# Patient Record
Sex: Female | Born: 1980 | Race: White | Hispanic: No | Marital: Married | Smoking: Former smoker
Health system: Southern US, Community
[De-identification: ages and names within clinical notes are randomized; demographics above are authoritative.]

## PROBLEM LIST (undated history)

## (undated) DIAGNOSIS — E079 Disorder of thyroid, unspecified: Secondary | ICD-10-CM

## (undated) HISTORY — DX: Disorder of thyroid, unspecified: E07.9

---

## 2015-06-14 ENCOUNTER — Other Ambulatory Visit: Payer: Self-pay | Admitting: Obstetrics and Gynecology

## 2015-06-14 DIAGNOSIS — R928 Other abnormal and inconclusive findings on diagnostic imaging of breast: Secondary | ICD-10-CM

## 2015-07-09 ENCOUNTER — Ambulatory Visit
Admission: RE | Admit: 2015-07-09 | Discharge: 2015-07-09 | Disposition: A | Discharge status: Home / Self Care | Payer: Managed Care, Other (non HMO) | Source: Ambulatory Visit | Attending: Obstetrics and Gynecology | Admitting: Obstetrics and Gynecology

## 2015-07-09 DIAGNOSIS — R928 Other abnormal and inconclusive findings on diagnostic imaging of breast: Secondary | ICD-10-CM

## 2015-07-12 ENCOUNTER — Other Ambulatory Visit: Payer: Self-pay

## 2015-12-03 ENCOUNTER — Ambulatory Visit (INDEPENDENT_AMBULATORY_CARE_PROVIDER_SITE_OTHER)
Admission: RE | Admit: 2015-12-03 | Discharge: 2015-12-03 | Disposition: A | Discharge status: Home / Self Care | Payer: Managed Care, Other (non HMO) | Source: Ambulatory Visit | Attending: Nurse Practitioner | Admitting: Nurse Practitioner

## 2015-12-03 ENCOUNTER — Other Ambulatory Visit (INDEPENDENT_AMBULATORY_CARE_PROVIDER_SITE_OTHER): Payer: Managed Care, Other (non HMO)

## 2015-12-03 ENCOUNTER — Other Ambulatory Visit: Payer: Self-pay | Admitting: *Deleted

## 2015-12-03 ENCOUNTER — Ambulatory Visit (INDEPENDENT_AMBULATORY_CARE_PROVIDER_SITE_OTHER): Payer: Managed Care, Other (non HMO) | Admitting: Nurse Practitioner

## 2015-12-03 ENCOUNTER — Encounter: Payer: Self-pay | Admitting: Nurse Practitioner

## 2015-12-03 VITALS — BP 118/78 | HR 68 | Ht 66.0 in | Wt 141.0 lb

## 2015-12-03 DIAGNOSIS — Z0001 Encounter for general adult medical examination with abnormal findings: Secondary | ICD-10-CM

## 2015-12-03 DIAGNOSIS — R062 Wheezing: Secondary | ICD-10-CM

## 2015-12-03 DIAGNOSIS — Z716 Tobacco abuse counseling: Secondary | ICD-10-CM

## 2015-12-03 DIAGNOSIS — F17200 Nicotine dependence, unspecified, uncomplicated: Secondary | ICD-10-CM

## 2015-12-03 DIAGNOSIS — E041 Nontoxic single thyroid nodule: Secondary | ICD-10-CM | POA: Diagnosis not present

## 2015-12-03 DIAGNOSIS — E559 Vitamin D deficiency, unspecified: Secondary | ICD-10-CM | POA: Insufficient documentation

## 2015-12-03 DIAGNOSIS — N6452 Nipple discharge: Secondary | ICD-10-CM | POA: Insufficient documentation

## 2015-12-03 LAB — CBC WITH DIFFERENTIAL/PLATELET
BASOS PCT: 0.3 % (ref 0.0–3.0)
Basophils Absolute: 0 10*3/uL (ref 0.0–0.1)
EOS PCT: 2.4 % (ref 0.0–5.0)
Eosinophils Absolute: 0.2 10*3/uL (ref 0.0–0.7)
HCT: 40.9 % (ref 36.0–46.0)
HEMOGLOBIN: 14.2 g/dL (ref 12.0–15.0)
Lymphocytes Relative: 38.5 % (ref 12.0–46.0)
Lymphs Abs: 3.9 10*3/uL (ref 0.7–4.0)
MCHC: 34.6 g/dL (ref 30.0–36.0)
MCV: 96.4 fl (ref 78.0–100.0)
MONO ABS: 0.7 10*3/uL (ref 0.1–1.0)
Monocytes Relative: 7.1 % (ref 3.0–12.0)
NEUTROS ABS: 5.3 10*3/uL (ref 1.4–7.7)
Neutrophils Relative %: 51.7 % (ref 43.0–77.0)
PLATELETS: 237 10*3/uL (ref 150.0–400.0)
RBC: 4.24 Mil/uL (ref 3.87–5.11)
RDW: 12.1 % (ref 11.5–15.5)
WBC: 10.3 10*3/uL (ref 4.0–10.5)

## 2015-12-03 LAB — COMPREHENSIVE METABOLIC PANEL
ALBUMIN: 4.7 g/dL (ref 3.5–5.2)
ALT: 15 U/L (ref 0–35)
AST: 18 U/L (ref 0–37)
Alkaline Phosphatase: 47 U/L (ref 39–117)
BILIRUBIN TOTAL: 0.4 mg/dL (ref 0.2–1.2)
BUN: 10 mg/dL (ref 6–23)
CHLORIDE: 104 meq/L (ref 96–112)
CO2: 27 mEq/L (ref 19–32)
CREATININE: 0.65 mg/dL (ref 0.40–1.20)
Calcium: 9.2 mg/dL (ref 8.4–10.5)
GFR: 108.64 mL/min (ref >60.00)
Glucose, Bld: 89 mg/dL (ref 70–99)
Potassium: 3.8 mEq/L (ref 3.5–5.1)
SODIUM: 140 meq/L (ref 135–145)
TOTAL PROTEIN: 7.7 g/dL (ref 6.0–8.3)

## 2015-12-03 LAB — LIPID PANEL
CHOLESTEROL: 158 mg/dL (ref 0–200)
HDL: 62.4 mg/dL (ref >39.00)
LDL CALC: 82 mg/dL (ref 0–99)
NonHDL: 95.72
Total CHOL/HDL Ratio: 3
Triglycerides: 70 mg/dL (ref 0.0–149.0)
VLDL: 14 mg/dL (ref 0.0–40.0)

## 2015-12-03 MED ORDER — VITAMIN D 1000 UNITS PO TABS: 2000.0000 [IU] | ORAL_TABLET | Freq: Every day | ORAL | 0 refills | Status: DC

## 2015-12-03 MED ORDER — ALBUTEROL SULFATE HFA 108 (90 BASE) MCG/ACT IN AERS: 1.0000 | INHALATION_SPRAY | Freq: Four times a day (QID) | RESPIRATORY_TRACT | 2 refills | Status: DC | PRN

## 2015-12-03 MED ORDER — BUPROPION HCL ER (SMOKING DET) 150 MG PO TB12: 150.0000 mg | ORAL_TABLET | Freq: Two times a day (BID) | ORAL | 1 refills | Status: DC

## 2015-12-03 NOTE — Progress Notes (Signed)
Normal results, see office note

## 2015-12-03 NOTE — Assessment & Plan Note (Addendum)
ductogram never done. Prolactin 2016: normal Mammogram 2016: normal.  referred to endocrinologist

## 2015-12-03 NOTE — Progress Notes (Signed)
Subjective:    Patient ID: Bonnie Parsons, female    DOB: [birth year], 35 y.o.   MRN: [id number]  Patient presents today for complete physical or establish care (new patient).  Wheezing   This is a recurrent problem. The current episode started 1 to 4 weeks ago. The problem occurs intermittently. The problem has been waxing and waning. Pertinent negatives include no chest pain, chills, coughing, diarrhea, fever, headaches, rash, rhinorrhea, shortness of breath, sore throat, sputum production or vomiting. The symptoms are aggravated by any activity. She has tried nothing for the symptoms. There is no history of asthma, bronchiolitis, CAD, chronic lung disease, COPD, DVT, heart failure, past MI, PE or pneumonia.   Patient moved to [place] from [facility] 50month ago. She las last seen by last pcp 1year ago.  Left Breast Discharge: Chronic and intermittent for over 1year. Denies any pregnancy, current use of nuvaring, denies any nipple stimulation activity, last mammogram 2016 (normal per patient), last prolactin 2016(normal). States she has an enlarged pituitory gland as teenager.([place] Never had ductogram or biopsy.  Tobacco use: Smoked for 252yr Has tried chantix and nicorette in past and unsuccessful.  Thyroid nodule: US[place]ore[place]yroid last done 2016: benign. Recommended yearly check. Patient is to bring record of last thyroid [place] thyroid function was normal.  Immunizations: (TDAP, Hep C screen, Pneumovax, Influenza, zoster)  Health Maintenance  Topic Date Due  . Pap Smear  04/21/1999  . Flu Shot  11/20/2016*  . Tetanus Vaccine  11/20/2016*  . HIV Screening  11/20/2016*  *Topic was postponed. The date shown is not the original due date.   Diet:none Weight:  Wt Readings from Last 3 Encounters:  12/03/15 141 lb (64 kg)   Exercise:none Fall Risk: Fall Risk  12/03/2015  Falls in the past year? No   Depression/Suicide: Depression screen PHQ 2/9 12/03/2015    Decreased Interest 0  Down, Depressed, Hopeless 0  PHQ - 2 Score 0   No flowsheet data found. Pap Smear (every 3y34yror >21-29 without HPV, every 70yr60yrr >30-670yr48yrh HPV): last done 2016 per patient, record requested. Advanced Directive: Advanced Directives 12/03/2015  Does patient have an advance directive? No  Would patient like information on creating an advanced directive? No - patient declined information   Sexual History (birth control, marital status, STD):married, sexually active, use of nuvaring.  Medications and allergies reviewed with patient and updated if appropriate.  Patient Active Problem List   Diagnosis Date Noted  . Thyroid nodule 12/03/2015  . Tobacco use disorder 12/03/2015  . Vitamin D deficiency 12/03/2015  . Breast discharge 12/03/2015    No current outpatient prescriptions on file prior to visit.   No current facility-administered medications on file prior to visit.     Past Medical History:  Diagnosis Date  . Thyroid disease     Past Surgical History:  Procedure Laterality Date  . CESAREAN SECTION      Social History   Social History  . Marital status: Married    Spouse name: N/A  . Number of children: N/A  . Years of education: N/A   Social History Main Topics  . Smoking status: Current Some Day Smoker    Packs/day: 0.25    Years: 20.00    Types: Cigarettes  . Smokeless tobacco: Current User  . Alcohol use No  . Drug use: No  . Sexual activity: Yes    Birth control/ protection: Inserts     Comment: nuvaring  x 21yr,    Other Topics Concern  . None   Social History Narrative  . None    Family History  Problem Relation Age of Onset  . Cancer Father   . Hyperlipidemia Paternal Grandmother   . Thyroid disease Mother         Review of Systems  Constitutional: Negative for chills, fever, malaise/fatigue and weight loss.  HENT: Negative for congestion, rhinorrhea and sore throat.   Eyes:       Negative for visual  changes  Respiratory: Positive for wheezing. Negative for cough, sputum production and shortness of breath.        Wheezing with exercise.  Cardiovascular: Negative for chest pain, palpitations and leg swelling.  Gastrointestinal: Negative for blood in stool, constipation, diarrhea, heartburn and vomiting.  Genitourinary: Negative for dysuria, frequency and urgency.  Musculoskeletal: Negative for falls, joint pain and myalgias.  Skin: Negative for rash.  Neurological: Negative for dizziness, sensory change and headaches.  Endo/Heme/Allergies: Does not bruise/bleed easily.  Psychiatric/Behavioral: Negative for depression, substance abuse and suicidal ideas. The patient is not nervous/anxious.     Objective:   Vitals:   12/03/15 1104  BP: 118/78  Pulse: 68    Body mass index is 22.76 kg/m.   Physical Examination:  Physical Exam  Constitutional: She is oriented to person, place, and time and well-developed, well-nourished, and in no distress. No distress.  HENT:  Right Ear: External ear normal.  Left Ear: External ear normal.  Nose: Nose normal.  Mouth/Throat: Oropharynx is clear and moist. No oropharyngeal exudate.  Eyes: Conjunctivae and EOM are normal. Pupils are equal, round, and reactive to light. No scleral icterus.  Neck: Normal range of motion. Neck supple. No thyromegaly present.  Cardiovascular: Normal rate, normal heart sounds and intact distal pulses.   Pulmonary/Chest: Effort normal and breath sounds normal. She exhibits no tenderness.  Abdominal: Soft. Bowel sounds are normal. She exhibits no distension. There is no tenderness.  Musculoskeletal: Normal range of motion. She exhibits no edema or tenderness.  Lymphadenopathy:    She has no cervical adenopathy.  Neurological: She is alert and oriented to person, place, and time. Gait normal.  Skin: Skin is warm and dry.  Psychiatric: Affect and judgment normal.  Vitals reviewed.  Spent 169ms discussing different  smoking cessation aids.  ASSESSMENT and PLAN:  MaFatishaas seen today for annual exam.  Diagnoses and all orders for this visit:  Encounter for preventative adult health care exam with abnormal findings -     Comp Met (CMET); Future -     Lipid panel; Future -     CBC w/Diff; Future -     cholecalciferol (VITAMIN D) 1000 units tablet; Take 2 tablets (2,000 Units total) by mouth daily.  Thyroid nodule -     [place]ROID; Future  Tobacco use disorder -     DG Chest 2 View; Future -     buPROPion (ZYBAN) 150 MG 12 hr tablet; Take 1 tablet (150 mg total) by mouth 2 (two) times daily.  Wheezing -     DG Chest 2 View; Future  Breast discharge Comments: left Orders: -     Ambulatory referral to Endocrinology  Encounter for tobacco use cessation counseling  Vitamin D deficiency -     cholecalciferol (VITAMIN D) 1000 units tablet; Take 2 tablets (2,000 Units total) by mouth daily.    No problem-specific Assessment & Plan notes found for this encounter.      Follow  up: Return in about 6 months (around 06/02/2016).  Wilfred Lacy, NP

## 2015-12-03 NOTE — Assessment & Plan Note (Signed)
Patient agreed to try zyban. Patient does not have quit date at this time.

## 2015-12-03 NOTE — Patient Instructions (Signed)
Go to basement for lab and CXR. If you decide to start zyban for smoking cessation, you will been to return to office in 39month after starting. We discussed common side effects such as nausea, drowsiness and weight gain.  Also discussed rare but serious side effect of suicide ideation.  She is instructed to discontinue medication go directly to ED if this occurs.  Pt verbalizes understanding.   Plan follow up in 1 month to evaluate progress.    Bupropion sustained-release tablets (smoking cessation) What is this medicine? BUPROPION (byoo PROE pee on) is used to help people quit smoking. This medicine may be used for other purposes; ask your health care provider or pharmacist if you have questions. What should I tell my health care provider before I take this medicine? They need to know if you have any of these conditions: -an eating disorder, such as anorexia or bulimia -bipolar disorder or psychosis -diabetes or high blood sugar, treated with medication -glaucoma -head injury or brain tumor -heart disease, previous heart attack, or irregular heart beat -high blood pressure -kidney or liver disease -seizures -suicidal thoughts or a previous suicide attempt -Tourette's syndrome -weight loss -an unusual or allergic reaction to bupropion, other medicines, foods, dyes, or preservatives -breast-feeding -pregnant or trying to become pregnant How should I use this medicine? Take this medicine by mouth with a glass of water. Follow the directions on the prescription label. You can take it with or without food. If it upsets your stomach, take it with food. Do not cut, crush or chew this medicine. Take your medicine at regular intervals. If you take this medicine more than once a day, take your second dose at least 8 hours after you take your first dose. To limit difficulty in sleeping, avoid taking this medicine at bedtime. Do not take your medicine more often than directed. Do not stop taking this  medicine suddenly except upon the advice of your doctor. Stopping this medicine too quickly may cause serious side effects. A special MedGuide will be given to you by the pharmacist with each prescription and refill. Be sure to read this information carefully each time. Talk to your pediatrician regarding the use of this medicine in children. Special care may be needed. Overdosage: If you think you have taken too much of this medicine contact a poison control center or emergency room at once. NOTE: This medicine is only for you. Do not share this medicine with others. What if I miss a dose? If you miss a dose, skip the missed dose and take your next tablet at the regular time. There should be at least 8 hours between doses. Do not take double or extra doses. What may interact with this medicine? Do not take this medicine with any of the following medications: -linezolid -MAOIs like Azilect, Carbex, Eldepryl, Marplan, Nardil, and Parnate -methylene blue (injected into a vein) -other medicines that contain bupropion like Wellbutrin This medicine may also interact with the following medications: -alcohol -certain medicines for anxiety or sleep -certain medicines for blood pressure like metoprolol, propranolol -certain medicines for depression or psychotic disturbances -certain medicines for HIV or AIDS like efavirenz, lopinavir, nelfinavir, ritonavir -certain medicines for irregular heart beat like propafenone, flecainide -certain medicines for Parkinson's disease like amantadine, levodopa -certain medicines for seizures like carbamazepine, phenytoin, phenobarbital -cimetidine -clopidogrel -cyclophosphamide -furazolidone -isoniazid -nicotine -orphenadrine -procarbazine -steroid medicines like prednisone or cortisone -stimulant medicines for attention disorders, weight loss, or to stay awake -tamoxifen -theophylline -thiotepa -ticlopidine -tramadol -warfarin This  list may not  describe all possible interactions. Give your health care provider a list of all the medicines, herbs, non-prescription drugs, or dietary supplements you use. Also tell them if you smoke, drink alcohol, or use illegal drugs. Some items may interact with your medicine. What should I watch for while using this medicine? Visit your doctor or health care professional for regular checks on your progress. This medicine should be used together with a patient support program. It is important to participate in a behavioral program, counseling, or other support program that is recommended by your health care professional. Patients and their families should watch out for new or worsening thoughts of suicide or depression. Also watch out for sudden changes in feelings such as feeling anxious, agitated, panicky, irritable, hostile, aggressive, impulsive, severely restless, overly excited and hyperactive, or not being able to sleep. If this happens, especially at the beginning of treatment or after a change in dose, call your health care professional. Avoid alcoholic drinks while taking this medicine. Drinking excessive alcoholic beverages, using sleeping or anxiety medicines, or quickly stopping the use of these agents while taking this medicine may increase your risk for a seizure. Do not drive or use heavy machinery until you know how this medicine affects you. This medicine can impair your ability to perform these tasks. Do not take this medicine close to bedtime. It may prevent you from sleeping. Your mouth may get dry. Chewing sugarless gum or sucking hard candy, and drinking plenty of water may help. Contact your doctor if the problem does not go away or is severe. Do not use nicotine patches or chewing gum without the advice of your doctor or health care professional while taking this medicine. You may need to have your blood pressure taken regularly if your doctor recommends that you use both nicotine and this  medicine together. What side effects may I notice from receiving this medicine? Side effects that you should report to your doctor or health care professional as soon as possible: -allergic reactions like skin rash, itching or hives, swelling of the face, lips, or tongue -breathing problems -changes in vision -confusion -fast or irregular heartbeat -hallucinations -increased blood pressure -redness, blistering, peeling or loosening of the skin, including inside the mouth -seizures -suicidal thoughts or other mood changes -unusually weak or tired -vomiting Side effects that usually do not require medical attention (report to your doctor or health care professional if they continue or are bothersome): -change in sex drive or performance -constipation -headache -loss of appetite -nausea -tremors -weight loss This list may not describe all possible side effects. Call your doctor for medical advice about side effects. You may report side effects to FDA at 1-800-FDA-1088. Where should I keep my medicine? Keep out of the reach of children. Store at room temperature between 20 and 25 degrees C (68 and 77 degrees F). Protect from light. Keep container tightly closed. Throw away any unused medicine after the expiration date. NOTE: This sheet is a summary. It may not cover all possible information. If you have questions about this medicine, talk to your doctor, pharmacist, or health care provider.    2016, Elsevier/Gold Standard. (2012-10-04 10:55:10)

## 2015-12-03 NOTE — Progress Notes (Signed)
Pre visit review using our clinic review tool, if applicable. No additional management support is needed unless otherwise documented below in the visit note. 

## 2016-03-24 ENCOUNTER — Ambulatory Visit
Admission: RE | Admit: 2016-03-24 | Discharge: 2016-03-24 | Disposition: A | Discharge status: Home / Self Care | Payer: Managed Care, Other (non HMO) | Source: Ambulatory Visit | Attending: Nurse Practitioner | Admitting: Nurse Practitioner

## 2016-03-24 DIAGNOSIS — E041 Nontoxic single thyroid nodule: Secondary | ICD-10-CM

## 2016-06-02 ENCOUNTER — Ambulatory Visit (INDEPENDENT_AMBULATORY_CARE_PROVIDER_SITE_OTHER): Payer: 59 | Admitting: Nurse Practitioner

## 2016-06-02 ENCOUNTER — Encounter: Payer: Self-pay | Admitting: Nurse Practitioner

## 2016-06-02 ENCOUNTER — Other Ambulatory Visit: Payer: 59

## 2016-06-02 VITALS — BP 110/74 | HR 79 | Temp 98.2°F | Ht 66.0 in | Wt 141.0 lb

## 2016-06-02 DIAGNOSIS — Z3044 Encounter for surveillance of vaginal ring hormonal contraceptive device: Secondary | ICD-10-CM

## 2016-06-02 DIAGNOSIS — E041 Nontoxic single thyroid nodule: Secondary | ICD-10-CM

## 2016-06-02 DIAGNOSIS — N6452 Nipple discharge: Secondary | ICD-10-CM

## 2016-06-02 DIAGNOSIS — E559 Vitamin D deficiency, unspecified: Secondary | ICD-10-CM | POA: Diagnosis not present

## 2016-06-02 LAB — PROLACTIN: Prolactin: 5.1 ng/mL

## 2016-06-02 MED ORDER — NUVARING 0.12-0.015 MG/24HR VA RING: 1.0000 | VAGINAL_RING | VAGINAL | 5 refills | Status: DC

## 2016-06-02 NOTE — Progress Notes (Signed)
Pre visit review using our clinic review tool, if applicable. No additional management support is needed unless otherwise documented below in the visit note. 

## 2016-06-02 NOTE — Progress Notes (Signed)
Subjective:  Patient ID: Bonnie Parsons, female    DOB: [birth year]  Age: 36 y.o. MRN: [id number]  CC: Follow-up (6 mo fu/nuvaring consult?--had pap last year)   HPI  She denies any acute complaint today.  Contraceptive refill: She will like nuvaring refilled. Previous prescription provided by GYN. Still have 3rings, and uses as prescribed. Current ongoing menstrual cycle.  Thyroid nodule: Stable, no throat pain, no change in voice of dysphagia.  Nipple discharge: Stable, no breast pain.  Outpatient Medications Prior to Visit  Medication Sig Dispense Refill  . cholecalciferol (VITAMIN D) 1000 units tablet Take 2 tablets (2,000 Units total) by mouth daily. 60 tablet 0  . NUVARING 0.12-0.015 MG/24HR vaginal ring     . albuterol (PROVENTIL HFA;VENTOLIN HFA) 108 (90 Base) MCG/ACT inhaler Inhale 1-2 puffs into the lungs every 6 (six) hours as needed for wheezing or shortness of breath. (Patient not taking: Reported on 06/02/2016) 1 Inhaler 2  . buPROPion (ZYBAN) 150 MG 12 hr tablet Take 1 tablet (150 mg total) by mouth 2 (two) times daily. (Patient not taking: Reported on 06/02/2016) 60 tablet 1   No facility-administered medications prior to visit.     ROS See HPI  Objective:  BP 110/74   Pulse 79   Temp 98.2 F (36.8 C)   Ht  (1.676 m)   Wt 141 lb (64 kg)   SpO2 99%   BMI 22.76 kg/m   BP Readings from Last 3 Encounters:  06/02/16 110/74  12/03/15 118/78    Wt Readings from Last 3 Encounters:  06/02/16 141 lb (64 kg)  12/03/15 141 lb (64 kg)    Physical Exam  Constitutional: She is oriented to person, place, and time. No distress.  Neck: Normal range of motion. Neck supple. No thyromegaly present.  Cardiovascular: Normal rate and normal heart sounds.   Pulmonary/Chest: Effort normal and breath sounds normal.  Musculoskeletal: She exhibits no edema.  Lymphadenopathy:    She has no cervical adenopathy.  Neurological: She is alert and oriented to person,  place, and time.  Skin: Skin is warm and dry.  Vitals reviewed.   Lab Results  Component Value Date   WBC 10.3 12/03/2015   HGB 14.2 12/03/2015   HCT 40.9 12/03/2015   PLT 237.0 12/03/2015   GLUCOSE 89 12/03/2015   CHOL 158 12/03/2015   TRIG 70.0 12/03/2015   HDL 62.40 12/03/2015   LDLCALC 82 12/03/2015   ALT 15 12/03/2015   AST 18 12/03/2015   NA 140 12/03/2015   K 3.8 12/03/2015   CL 104 12/03/2015   CREATININE 0.65 12/03/2015   BUN 10 12/03/2015   CO2 27 12/03/2015    US Thyroid  Result Date: 03/24/2016 CLINICAL DATA:  Possible thyroid nodule by outside ultrasound EXAM: THYROID ULTRASOUND TECHNIQUE: Ultrasound examination of the thyroid gland and adjacent soft tissues was performed. COMPARISON:  None available FINDINGS: Parenchymal Echotexture: Mildly heterogenous Isthmus: 3 mm Right lobe: 4.7 x 1.0 x 1.6 cm Left lobe: 4.7 x 1.1 x 1.8 cm _________________________________________________________ Estimated total number of nodules >/= 1 cm: 0 Number of spongiform nodules >/=  2 cm not described below (TR1): 0 Number of mixed cystic and solid nodules >/= 1.5 cm not described below (TR2): 0 No discrete nodules are seen within the thyroid gland. IMPRESSION: Minimally heterogeneous thyroid gland without discrete nodule or focal abnormality. No significant finding. Negative for adenopathy. The above is in keeping with the ACR TI-RADS recommendations - J Am Coll Radiol 2017;14:587-595.  Electronically Signed   By: Judie Petit.  Shick M.D.   On: 03/24/2016 16:50    Assessment & Plan:   Jalaya was seen today for follow-up.  Diagnoses and all orders for this visit:  Thyroid nodule -     Thyroid Profile; Future  Vitamin D deficiency -     Vitamin D 1,25 dihydroxy; Future  Discharge from left nipple -     Prolactin; Future  Encounter for surveillance of vaginal ring hormonal contraceptive device -     NUVARING 0.12-0.015 MG/24HR vaginal ring; Place 1 each vaginally every 28 (twenty-eight)  days.   I have discontinued Ms. Wronski's buPROPion and albuterol. I have also changed her NUVARING. Additionally, I am having her maintain her cholecalciferol.  Meds ordered this encounter  Medications  . NUVARING 0.12-0.015 MG/24HR vaginal ring    Sig: Place 1 each vaginally every 28 (twenty-eight) days.    Dispense:  3 each    Refill:  5    Order Specific Question:   Supervising Provider    Answer:   Tresa Garter [[id number]]    Follow-up: Return in about 6 months (around 12/02/2016) for CPE (fasting, will need pap smear.Alysia Penna, NP

## 2016-06-02 NOTE — Patient Instructions (Signed)
Go to lab for blood draw.

## 2016-06-03 LAB — THYROID PANEL
Free Thyroxine Index: 2.4 (ref 1.2–4.9)
T3 Uptake Ratio: 27 % (ref 24–39)
T4 TOTAL: 9 ug/dL (ref 4.5–12.0)

## 2016-06-07 ENCOUNTER — Other Ambulatory Visit: Payer: Self-pay | Admitting: Nurse Practitioner

## 2016-06-07 DIAGNOSIS — Z1231 Encounter for screening mammogram for malignant neoplasm of breast: Secondary | ICD-10-CM

## 2016-06-07 LAB — VITAMIN D 1,25 DIHYDROXY
Vitamin D 1, 25 (OH)2 Total: 71 pg/mL (ref 18–72)
Vitamin D2 1, 25 (OH)2: <8 pg/mL
Vitamin D3 1, 25 (OH)2: 71 pg/mL

## 2016-07-13 ENCOUNTER — Ambulatory Visit
Admission: RE | Admit: 2016-07-13 | Discharge: 2016-07-13 | Disposition: A | Discharge status: Home / Self Care | Payer: 59 | Source: Ambulatory Visit | Attending: Nurse Practitioner | Admitting: Nurse Practitioner

## 2016-07-13 DIAGNOSIS — Z1231 Encounter for screening mammogram for malignant neoplasm of breast: Secondary | ICD-10-CM

## 2016-11-15 ENCOUNTER — Telehealth: Payer: Self-pay | Admitting: Nurse Practitioner

## 2016-11-15 NOTE — Telephone Encounter (Signed)
Called pt to confirm transfer she said that she would like to stay with Bonnie Parsons but mentioned that she was to have labs done that morning and then come back for the visit. Will she be able to do that at the new office or should she have them done at [facility] before hand? Please advise.

## 2016-11-16 NOTE — Telephone Encounter (Signed)
Bonnie Parsons does blood work or any test during and after office. Please help inform pt.

## 2016-11-16 NOTE — Telephone Encounter (Signed)
Called pt to let her know. She said that after she spoke with me yesterday she realized that the [facility] location was taking new patients and it is very close to her. She is going to check with them to see how soon she can get in to establish care with them.

## 2016-12-15 ENCOUNTER — Ambulatory Visit: Payer: 59 | Admitting: Family Medicine

## 2016-12-15 ENCOUNTER — Encounter: Payer: 59 | Admitting: Nurse Practitioner

## 2016-12-15 ENCOUNTER — Ambulatory Visit (INDEPENDENT_AMBULATORY_CARE_PROVIDER_SITE_OTHER): Payer: 59 | Admitting: Family Medicine

## 2016-12-15 VITALS — BP 110/80 | HR 79 | Ht 64.5 in | Wt 140.2 lb

## 2016-12-15 DIAGNOSIS — E229 Hyperfunction of pituitary gland, unspecified: Secondary | ICD-10-CM

## 2016-12-15 DIAGNOSIS — F17210 Nicotine dependence, cigarettes, uncomplicated: Secondary | ICD-10-CM

## 2016-12-15 DIAGNOSIS — Z0001 Encounter for general adult medical examination with abnormal findings: Secondary | ICD-10-CM

## 2016-12-15 DIAGNOSIS — E041 Nontoxic single thyroid nodule: Secondary | ICD-10-CM | POA: Diagnosis not present

## 2016-12-15 DIAGNOSIS — Z309 Encounter for contraceptive management, unspecified: Secondary | ICD-10-CM | POA: Diagnosis not present

## 2016-12-15 DIAGNOSIS — Z3044 Encounter for surveillance of vaginal ring hormonal contraceptive device: Secondary | ICD-10-CM

## 2016-12-15 DIAGNOSIS — Z87891 Personal history of nicotine dependence: Secondary | ICD-10-CM | POA: Insufficient documentation

## 2016-12-15 DIAGNOSIS — F17200 Nicotine dependence, unspecified, uncomplicated: Secondary | ICD-10-CM | POA: Insufficient documentation

## 2016-12-15 DIAGNOSIS — R7989 Other specified abnormal findings of blood chemistry: Secondary | ICD-10-CM | POA: Insufficient documentation

## 2016-12-15 LAB — COMPREHENSIVE METABOLIC PANEL
ALK PHOS: 47 U/L (ref 39–117)
ALT: 16 U/L (ref 0–35)
AST: 18 U/L (ref 0–37)
Albumin: 4.6 g/dL (ref 3.5–5.2)
BILIRUBIN TOTAL: 0.6 mg/dL (ref 0.2–1.2)
BUN: 11 mg/dL (ref 6–23)
CALCIUM: 9.5 mg/dL (ref 8.4–10.5)
CO2: 28 meq/L (ref 19–32)
CREATININE: 0.59 mg/dL (ref 0.40–1.20)
Chloride: 105 mEq/L (ref 96–112)
GFR: 120.82 mL/min (ref >60.00)
Glucose, Bld: 110 mg/dL — ABNORMAL HIGH (ref 70–99)
Potassium: 4.3 mEq/L (ref 3.5–5.1)
Sodium: 139 mEq/L (ref 135–145)
TOTAL PROTEIN: 7.1 g/dL (ref 6.0–8.3)

## 2016-12-15 LAB — LIPID PANEL
CHOLESTEROL: 154 mg/dL (ref 0–200)
HDL: 60.9 mg/dL (ref >39.00)
LDL Cholesterol: 82 mg/dL (ref 0–99)
NONHDL: 92.61
Total CHOL/HDL Ratio: 3
Triglycerides: 54 mg/dL (ref 0.0–149.0)
VLDL: 10.8 mg/dL (ref 0.0–40.0)

## 2016-12-15 LAB — CBC
HCT: 44 % (ref 36.0–46.0)
Hemoglobin: 14.5 g/dL (ref 12.0–15.0)
MCHC: 33 g/dL (ref 30.0–36.0)
MCV: 102.4 fl — AB (ref 78.0–100.0)
PLATELETS: 185 10*3/uL (ref 150.0–400.0)
RBC: 4.3 Mil/uL (ref 3.87–5.11)
RDW: 12.5 % (ref 11.5–15.5)
WBC: 7.6 10*3/uL (ref 4.0–10.5)

## 2016-12-15 LAB — TSH: TSH: 1.32 u[IU]/mL (ref 0.35–4.50)

## 2016-12-15 LAB — HEMOGLOBIN A1C: HEMOGLOBIN A1C: 5.2 % (ref 4.6–6.5)

## 2016-12-15 MED ORDER — VARENICLINE TARTRATE 0.5 MG X 11 & 1 MG X 42 PO MISC: ORAL | 0 refills | Status: DC

## 2016-12-15 MED ORDER — NUVARING 0.12-0.015 MG/24HR VA RING: 1.0000 | VAGINAL_RING | VAGINAL | 2 refills | Status: DC

## 2016-12-15 NOTE — Assessment & Plan Note (Addendum)
Patient was asked about her tobacco use today and was strongly advised to quit. Patient is currently contemplative. We reviewed treatment options to assist her quit smoking including NRT, Chantix, and Bupropion.  She will start Chantix.  Discussed side effects of this medication.  Follow up at next office visit.   Time spent approximately 5 minutes.

## 2016-12-15 NOTE — Assessment & Plan Note (Signed)
Check TSH 

## 2016-12-15 NOTE — Patient Instructions (Signed)
Start the chantix.  Otherwise keep up the good work!  We will check blood work today.   Preventive Care 18-39 Years, Female Preventive care refers to lifestyle choices and visits with your health care provider that can promote health and wellness. What does preventive care include?  A yearly physical exam. This is also called an annual well check.  Dental exams once or twice a year.  Routine eye exams. Ask your health care provider how often you should have your eyes checked.  Personal lifestyle choices, including: ? Daily care of your teeth and gums. ? Regular physical activity. ? Eating a healthy diet. ? Avoiding tobacco and drug use. ? Limiting alcohol use. ? Practicing safe sex. ? Taking vitamin and mineral supplements as recommended by your health care provider. What happens during an annual well check? The services and screenings done by your health care provider during your annual well check will depend on your age, overall health, lifestyle risk factors, and family history of disease. Counseling Your health care provider may ask you questions about your:  Alcohol use.  Tobacco use.  Drug use.  Emotional well-being.  Home and relationship well-being.  Sexual activity.  Eating habits.  Work and work Statistician.  Method of birth control.  Menstrual cycle.  Pregnancy history.  Screening You may have the following tests or measurements:  Height, weight, and BMI.  Diabetes screening. This is done by checking your blood sugar (glucose) after you have not eaten for a while (fasting).  Blood pressure.  Lipid and cholesterol levels. These may be checked every 5 years starting at age 63.  Skin check.  Hepatitis C blood test.  Hepatitis B blood test.  Sexually transmitted disease (STD) testing.  BRCA-related cancer screening. This may be done if you have a family history of breast, ovarian, tubal, or peritoneal cancers.  Pelvic exam and Pap test.  This may be done every 3 years starting at age 63. Starting at age 59, this may be done every 5 years if you have a Pap test in combination with an HPV test.  Discuss your test results, treatment options, and if necessary, the need for more tests with your health care provider. Vaccines Your health care provider may recommend certain vaccines, such as:  Influenza vaccine. This is recommended every year.  Tetanus, diphtheria, and acellular pertussis (Tdap, Td) vaccine. You may need a Td booster every 10 years.  Varicella vaccine. You may need this if you have not been vaccinated.  HPV vaccine. If you are 107 or younger, you may need three doses over 6 months.  Measles, mumps, and rubella (MMR) vaccine. You may need at least one dose of MMR. You may also need a second dose.  Pneumococcal 13-valent conjugate (PCV13) vaccine. You may need this if you have certain conditions and were not previously vaccinated.  Pneumococcal polysaccharide (PPSV23) vaccine. You may need one or two doses if you smoke cigarettes or if you have certain conditions.  Meningococcal vaccine. One dose is recommended if you are age 42-21 years and a first-year college student living in a residence hall, or if you have one of several medical conditions. You may also need additional booster doses.  Hepatitis A vaccine. You may need this if you have certain conditions or if you travel or work in places where you may be exposed to hepatitis A.  Hepatitis B vaccine. You may need this if you have certain conditions or if you travel or work in places where  you may be exposed to hepatitis B.  Haemophilus influenzae type b (Hib) vaccine. You may need this if you have certain risk factors.  Talk to your health care provider about which screenings and vaccines you need and how often you need them. This information is not intended to replace advice given to you by your health care provider. Make sure you discuss any questions you  have with your health care provider. Document Released: 04/04/2001 Document Revised: 10/27/2015 Document Reviewed: 12/08/2014 Elsevier Interactive Patient Education  2017 [employer].

## 2016-12-15 NOTE — Progress Notes (Addendum)
Subjective:  Bonnie Parsons is a 36 y.o. female who presents today for her annual comprehensive physical exam.    HPI:  Bonnie ShackletonMaria Bevard has no acute complaints today.   Nicotine dependence Patient currently smokes about 1/4 pack/day.  She is actively trying to quit.  She has tried several treatments in the past including nicotine replacement, Wellbutrin, and Chantix.  Thinks that Chantix worked okay but was only on it for about a month.  Lifestyle Diet: Healthy diet. Does not eat a lot of meat.  Exercise: Goes to the gym daily. Runs regularly.   [facility]  Decreased Interest 0  Down, Depressed, Hopeless 0  PHQ - 2 Score 0    Pertinent Gynecological History: Patient's last menstrual period was 12/06/2016. Sexually active: Yes with female. Menses: flow is moderate Contraception: NuvaRing vaginal inserts Blood transfusions: none Sexually transmitted diseases: no past history  OB History    No data available      Health Maintenance Due  Topic Date Due  . HIV Screening  04/20/1993  . PAP SMEAR  04/21/1999    ROS: Per HPI, otherwise a 14 point review of systems was performed and was negative  PMH:  The following were reviewed and entered/updated in epic: Past Medical History:  Diagnosis Date  . Thyroid disease    Patient Active Problem List   Diagnosis Date Noted  . Elevated prolactin level (HCC) 12/15/2016  . Nicotine dependence 12/15/2016  . Contraception management 12/15/2016  . Thyroid nodule 12/03/2015  . Vitamin D deficiency 12/03/2015   Past Surgical History:  Procedure Laterality Date  . CESAREAN SECTION      Family History  Problem Relation Age of Onset  . Cancer Father        Lung  . Early death Father   . Hyperlipidemia Paternal Grandmother   . Hypertension Paternal Grandmother   . Thyroid disease Mother   . Asthma Mother   . Breast cancer Neg Hx     Medications- reviewed and updated Current Outpatient  Prescriptions  Medication Sig Dispense Refill  . NUVARING 0.12-0.015 MG/24HR vaginal ring Place 1 each vaginally every 28 (twenty-eight) days. 3 each 2  . varenicline (CHANTIX STARTING MONTH PAK) 0.5 MG X 11 & 1 MG X 42 tablet Take one 0.5 mg tablet by mouth qd for 3 days, then increase to one 0.5 mg tablet bid for 4 days, then increase to one 1 mg tablet bid. 53 tablet 0   No current facility-administered medications for this visit.     Allergies-reviewed and updated Allergies  Allergen Reactions  . No Known Allergies     Social History   Social History  . Marital status: Married    Spouse name: N/A  . Number of children: N/A  . Years of education: N/A   Social History Main Topics  . Smoking status: Current Some Day Smoker    Packs/day: 0.25    Years: 20.00    Types: Cigarettes  . Smokeless tobacco: Never Used  . Alcohol use Yes  . Drug use: No  . Sexual activity: Yes    Partners: Male    Birth control/ protection: Inserts   Other Topics Concern  . None   Social History Narrative  . None   Objective:  Physical Exam: BP 110/80   Pulse 79   Ht 5' 4.5" (1.638 m)   Wt 140 lb 3.2 oz (63.6 kg)   LMP 12/06/2016   SpO2 99%   BMI  23.69 kg/m   Body mass index is 23.69 kg/m. Gen: NAD, resting comfortably HEENT: TMs normal bilaterally. OP clear. No thyromegaly noted.  CV: RRR with no murmurs appreciated Pulm: NWOB, CTAB with no crackles, wheezes, or rhonchi GI: Normal bowel sounds present. Soft, Nontender, Nondistended. MSK: no edema, cyanosis, or clubbing noted Skin: warm, dry Neuro: CN2-12 grossly intact. Strength 5/5 in upper and lower extremities. Reflexes symmetric and intact bilaterally.  Psych: Normal affect and thought content  Assessment/Plan:  Nicotine dependence Patient was asked about her tobacco use today and was strongly advised to quit. Patient is currently contemplative. We reviewed treatment options to assist her quit smoking including NRT,  Chantix, and Bupropion.  She will start Chantix.  Discussed side effects of this medication.  Follow up at next office visit.   Time spent approximately 5 minutes.   Elevated prolactin level (HCC) Check prolactin level today.  Thyroid nodule Check TSH.  Contraception management Doing well on NuvaRing.  This was refilled today.  Preventative Healthcare: Flu shot declined.  Pap deferred-patient will be seen OB/GYN for this.  Check lipid panel and basic labs today.  Check A1c.  Patient Counseling:  -Nutrition: Stressed importance of moderation in sodium/caffeine intake, saturated fat and cholesterol, caloric balance, sufficient intake of fresh fruits, vegetables, and fiber.  -Stressed the importance of regular exercise.   -Substance Abuse: Discussed cessation/primary prevention of tobacco, alcohol, or other drug use; driving or other dangerous activities under the influence; availability of treatment for abuse.   -Injury prevention: Discussed safety belts, safety helmets, smoke detector, smoking near bedding or upholstery.   -Sexuality: Discussed sexually transmitted diseases, partner selection, use of condoms, avoidance of unintended pregnancy and contraceptive alternatives.   -Dental health: Discussed importance of regular tooth brushing, flossing, and dental visits.  -Health maintenance and immunizations reviewed. Please refer to Health maintenance section.  Return to care in 1 year for next preventative visit.   Katina Degree. [name], MD 12/18/2016 12:44 PM

## 2016-12-15 NOTE — Assessment & Plan Note (Signed)
Doing well on NuvaRing.  This was refilled today.

## 2016-12-15 NOTE — Assessment & Plan Note (Signed)
Check prolactin level today.

## 2016-12-16 LAB — PROLACTIN: PROLACTIN: 5.4 ng/mL

## 2016-12-18 ENCOUNTER — Encounter: Payer: Self-pay | Admitting: Family Medicine

## 2016-12-19 ENCOUNTER — Encounter: Payer: Self-pay | Admitting: *Deleted

## 2017-09-19 ENCOUNTER — Other Ambulatory Visit: Payer: Self-pay | Admitting: Family Medicine

## 2017-09-19 DIAGNOSIS — Z1231 Encounter for screening mammogram for malignant neoplasm of breast: Secondary | ICD-10-CM

## 2017-10-19 ENCOUNTER — Ambulatory Visit
Admission: RE | Admit: 2017-10-19 | Discharge: 2017-10-19 | Disposition: A | Discharge status: Home / Self Care | Payer: 59 | Source: Ambulatory Visit

## 2017-10-19 DIAGNOSIS — Z1231 Encounter for screening mammogram for malignant neoplasm of breast: Secondary | ICD-10-CM

## 2017-10-23 ENCOUNTER — Other Ambulatory Visit: Payer: Self-pay | Admitting: Family Medicine

## 2017-10-23 DIAGNOSIS — Z3044 Encounter for surveillance of vaginal ring hormonal contraceptive device: Secondary | ICD-10-CM

## 2017-10-31 ENCOUNTER — Telehealth: Payer: Self-pay | Admitting: Family Medicine

## 2017-10-31 NOTE — Telephone Encounter (Signed)
See note

## 2017-10-31 NOTE — Telephone Encounter (Signed)
Copied from CRM [id number]. Topic: Inquiry >> Oct 31, 2017  4:23 PM Maia Petties wrote: Reason for CRM: pt is calling to see if she is to repeat thyroid US. She thought it is supposed to be checked yearly. Please advise.

## 2017-11-01 NOTE — Telephone Encounter (Signed)
Please advise 

## 2017-11-06 NOTE — Telephone Encounter (Signed)
Radiologist last year did not recommend a repeat scan.   Would recommend re-scanning if she has had any change in symptoms (size, shape, pain, pressure, etc).  Katina Degreealeb M. Jimmey RalphParker, MD 11/06/2017 9:25 AM

## 2017-11-07 NOTE — Telephone Encounter (Signed)
Spoke with patient and let her know that a repeat scan was not necessary unless she was having a change in symptoms.  Patient verbalized understanding and stated she had no new symptoms at this time but would let our office know if anything changes in the future.

## 2017-11-16 ENCOUNTER — Encounter: Payer: Self-pay | Admitting: Obstetrics and Gynecology

## 2017-11-16 ENCOUNTER — Ambulatory Visit (INDEPENDENT_AMBULATORY_CARE_PROVIDER_SITE_OTHER): Payer: 59 | Admitting: Obstetrics and Gynecology

## 2017-11-16 VITALS — BP 120/80 | HR 69 | Ht 64.0 in | Wt 155.3 lb

## 2017-11-16 DIAGNOSIS — N6452 Nipple discharge: Secondary | ICD-10-CM

## 2017-11-16 DIAGNOSIS — Z124 Encounter for screening for malignant neoplasm of cervix: Secondary | ICD-10-CM

## 2017-11-16 DIAGNOSIS — Z1151 Encounter for screening for human papillomavirus (HPV): Secondary | ICD-10-CM

## 2017-11-16 DIAGNOSIS — Z113 Encounter for screening for infections with a predominantly sexual mode of transmission: Secondary | ICD-10-CM

## 2017-11-16 DIAGNOSIS — Z01419 Encounter for gynecological examination (general) (routine) without abnormal findings: Secondary | ICD-10-CM | POA: Diagnosis not present

## 2017-11-16 DIAGNOSIS — Z3009 Encounter for other general counseling and advice on contraception: Secondary | ICD-10-CM

## 2017-11-16 NOTE — Progress Notes (Signed)
Stop using Nuva Ring last month, didn't know if she needed a break from it or not. Pt states prolactin is normal but still is having a little d/c in left breast.

## 2017-11-16 NOTE — Progress Notes (Signed)
GYNECOLOGY ANNUAL PREVENTATIVE CARE ENCOUNTER NOTE  Subjective:   Bonnie Parsons is a 37 y.o. G31P1001 female here for a annual gynecologic exam. Current complaints: nipple discharge.  Denies abnormal vaginal bleeding, discharge, pelvic pain, problems with intercourse or other gynecologic concerns. Has yellow-white discharge of left breast that has been ongoing for a while. Has had prolactin test that has been normal (used to be high).   Has been on nuvaring for 20 years, stopped it last month because she has been on it a while and thought she needed a break. Has not had a period yet since stopping.    Gynecologic History Patient's last menstrual period was 10/25/2017 (exact date). Contraception: none Last Pap: 2017. Results were: normal Last mammogram: 10/19/2017. Results were: Birads 1  Obstetric History OB History  Gravida Para Term Preterm AB Living  1 1 1  0 0 1  SAB TAB Ectopic Multiple Live Births  0 0 0 0 1    # Outcome Date GA Lbr Len/2nd Weight Sex Delivery Anes PTL Lv  1 Term             Past Medical History:  Diagnosis Date  . Thyroid disease     Past Surgical History:  Procedure Laterality Date  . CESAREAN SECTION      Current Outpatient Medications on File Prior to Visit  Medication Sig Dispense Refill  . NUVARING 0.12-0.015 MG/24HR vaginal ring PLACE 1 RING EACH VAGINALLY EVERY 28 DAYS 3 each 4  . varenicline (CHANTIX STARTING MONTH PAK) 0.5 MG X 11 & 1 MG X 42 tablet Take one 0.5 mg tablet by mouth qd for 3 days, then increase to one 0.5 mg tablet bid for 4 days, then increase to one 1 mg tablet bid. (Patient not taking: Reported on 11/16/2017) 53 tablet 0   No current facility-administered medications on file prior to visit.     Allergies  Allergen Reactions  . No Known Allergies     Social History   Socioeconomic History  . Marital status: Married    Spouse name: Not on file  . Number of children: Not on file  . Years of education: Not on  file  . Highest education level: Not on file  Occupational History  . Not on file  Social Needs  . Financial resource strain: Not on file  . Food insecurity:    Worry: Not on file    Inability: Not on file  . Transportation needs:    Medical: Not on file    Non-medical: Not on file  Tobacco Use  . Smoking status: Current Some Day Smoker    Packs/day: 0.25    Years: 20.00    Pack years: 5.00    Types: Cigarettes  . Smokeless tobacco: Never Used  Substance and Sexual Activity  . Alcohol use: Yes  . Drug use: No  . Sexual activity: Yes    Partners: Male    Birth control/protection: Inserts  Lifestyle  . Physical activity:    Days per week: Not on file    Minutes per session: Not on file  . Stress: Not on file  Relationships  . Social connections:    Talks on phone: Not on file    Gets together: Not on file    Attends religious service: Not on file    Active member of club or organization: Not on file    Attends meetings of clubs or organizations: Not on file    Relationship status:  Not on file  . Intimate partner violence:    Fear of current or ex partner: Not on file    Emotionally abused: Not on file    Physically abused: Not on file    Forced sexual activity: Not on file  Other Topics Concern  . Not on file  Social History Narrative  . Not on file    Family History  Problem Relation Age of Onset  . Cancer Father        Lung  . Early death Father   . Hyperlipidemia Paternal Grandmother   . Hypertension Paternal Grandmother   . Thyroid disease Mother   . Asthma Mother   . Breast cancer Neg Hx    Diet: tries to eat healthy  Exercise: exercises daily, runs and workout videos  The following portions of the patient's history were reviewed and updated as appropriate: allergies, current medications, past family history, past medical history, past social history, past surgical history and problem list.  Review of Systems Pertinent items are noted in HPI.     Objective:  BP 120/80   Pulse 69   Ht 5\' 4"  (1.626 m)   Wt 155 lb 4.8 oz (70.4 kg)   LMP 10/25/2017 (Exact Date)   BMI 26.66 kg/m  CONSTITUTIONAL: Well-developed, well-nourished female in no acute distress.  HENT:  Normocephalic, atraumatic, External right and left ear normal. Oropharynx is clear and moist EYES: Conjunctivae and EOM are normal. Pupils are equal, round, and reactive to light. No scleral icterus.  NECK: Normal range of motion, supple, no masses.  Normal thyroid.  SKIN: Skin is warm and dry. No rash noted. Not diaphoretic. No erythema. No pallor. NEUROLOGIC: Alert and oriented to person, place, and time. Normal reflexes, muscle tone coordination. No cranial nerve deficit noted. PSYCHIATRIC: Normal mood and affect. Normal behavior. Normal judgment and thought content. CARDIOVASCULAR: Normal heart rate noted, regular rhythm RESPIRATORY: Clear to auscultation bilaterally. Effort and breath sounds normal, no problems with respiration noted. BREASTS: Symmetric in size. No masses, skin changes, nipple drainage, or lymphadenopathy. ABDOMEN: Soft, normal bowel sounds, no distention noted.  No tenderness, rebound or guarding.  PELVIC: Normal appearing external genitalia; normal appearing vaginal mucosa and cervix. Moderate amount thick white discharge.  Pap smear obtained.  Normal uterine size, no other palpable masses, no uterine or adnexal tenderness. MUSCULOSKELETAL: Normal range of motion. No tenderness.  No cyanosis, clubbing, or edema.  2+ distal pulses.   Assessment and Plan:   1. Well woman exam with routine gynecological exam Benign exam - Cytology - PAP  2. Routine screening for STI (sexually transmitted infection) Declines GC/CT - HIV antibody (with reflex) - Hepatitis C Antibody - Hepatitis B surface antigen - RPR  3. Nipple discharge Normal mammogram 2 months ago - Prolactin  4. Encounter for counseling regarding contraception Counseled patient regarding  risks/benefits of stopping vs continuing nuvaring including not have contraception, slightly increased risks of clots with estrogen hormonal birth control. She verbalizes understanding of risks and desires to continue nuvaring. To call for refills prn   Will follow up results of pap smear/STI screen and manage accordingly. Encouraged improvement in diet and exercise.  Mammogram UTD  Routine preventative health maintenance measures emphasized. Please refer to After Visit Summary for other counseling recommendations.    Baldemar Lenis, M.D. [facility]

## 2017-11-17 LAB — PROLACTIN: Prolactin: 10.4 ng/mL (ref 4.8–23.3)

## 2017-11-17 LAB — HEPATITIS C ANTIBODY: Hep C Virus Ab: <0.1 s/co ratio (ref 0.0–0.9)

## 2017-11-17 LAB — RPR: RPR Ser Ql: NONREACTIVE

## 2017-11-17 LAB — HEPATITIS B SURFACE ANTIGEN: Hepatitis B Surface Ag: NEGATIVE

## 2017-11-17 LAB — HIV ANTIBODY (ROUTINE TESTING W REFLEX): HIV Screen 4th Generation wRfx: NONREACTIVE

## 2017-11-20 LAB — CYTOLOGY - PAP
Diagnosis: NEGATIVE
HPV (WINDOPATH): NOT DETECTED

## 2017-11-29 ENCOUNTER — Encounter: Payer: Self-pay | Admitting: *Deleted

## 2017-12-21 ENCOUNTER — Encounter: Payer: Self-pay | Admitting: Family Medicine

## 2017-12-21 ENCOUNTER — Ambulatory Visit (INDEPENDENT_AMBULATORY_CARE_PROVIDER_SITE_OTHER): Payer: 59

## 2017-12-21 ENCOUNTER — Ambulatory Visit (INDEPENDENT_AMBULATORY_CARE_PROVIDER_SITE_OTHER): Payer: 59 | Admitting: Family Medicine

## 2017-12-21 VITALS — BP 118/72 | HR 81 | Temp 98.4°F | Ht 64.0 in | Wt 148.4 lb

## 2017-12-21 DIAGNOSIS — G8929 Other chronic pain: Secondary | ICD-10-CM | POA: Diagnosis not present

## 2017-12-21 DIAGNOSIS — M549 Dorsalgia, unspecified: Secondary | ICD-10-CM | POA: Diagnosis not present

## 2017-12-21 DIAGNOSIS — E041 Nontoxic single thyroid nodule: Secondary | ICD-10-CM | POA: Diagnosis not present

## 2017-12-21 DIAGNOSIS — R109 Unspecified abdominal pain: Secondary | ICD-10-CM

## 2017-12-21 DIAGNOSIS — R739 Hyperglycemia, unspecified: Secondary | ICD-10-CM | POA: Diagnosis not present

## 2017-12-21 DIAGNOSIS — F17210 Nicotine dependence, cigarettes, uncomplicated: Secondary | ICD-10-CM | POA: Diagnosis not present

## 2017-12-21 DIAGNOSIS — Z13220 Encounter for screening for lipoid disorders: Secondary | ICD-10-CM | POA: Diagnosis not present

## 2017-12-21 DIAGNOSIS — M545 Low back pain, unspecified: Secondary | ICD-10-CM

## 2017-12-21 DIAGNOSIS — Z0001 Encounter for general adult medical examination with abnormal findings: Secondary | ICD-10-CM

## 2017-12-21 DIAGNOSIS — E229 Hyperfunction of pituitary gland, unspecified: Secondary | ICD-10-CM

## 2017-12-21 DIAGNOSIS — R7989 Other specified abnormal findings of blood chemistry: Secondary | ICD-10-CM

## 2017-12-21 LAB — COMPREHENSIVE METABOLIC PANEL
ALBUMIN: 4.7 g/dL (ref 3.5–5.2)
ALT: 21 U/L (ref 0–35)
AST: 23 U/L (ref 0–37)
Alkaline Phosphatase: 51 U/L (ref 39–117)
BILIRUBIN TOTAL: 0.6 mg/dL (ref 0.2–1.2)
BUN: 8 mg/dL (ref 6–23)
CO2: 26 mEq/L (ref 19–32)
CREATININE: 0.67 mg/dL (ref 0.40–1.20)
Calcium: 9.7 mg/dL (ref 8.4–10.5)
Chloride: 104 mEq/L (ref 96–112)
GFR: 103.79 mL/min (ref >60.00)
Glucose, Bld: 88 mg/dL (ref 70–99)
Potassium: 4.4 mEq/L (ref 3.5–5.1)
Sodium: 141 mEq/L (ref 135–145)
Total Protein: 7.3 g/dL (ref 6.0–8.3)

## 2017-12-21 LAB — LIPID PANEL
CHOL/HDL RATIO: 3
Cholesterol: 193 mg/dL (ref 0–200)
HDL: 63.5 mg/dL (ref >39.00)
LDL Cholesterol: 117 mg/dL — ABNORMAL HIGH (ref 0–99)
NONHDL: 129.57
Triglycerides: 65 mg/dL (ref 0.0–149.0)
VLDL: 13 mg/dL (ref 0.0–40.0)

## 2017-12-21 LAB — TSH: TSH: 1.05 u[IU]/mL (ref 0.35–4.50)

## 2017-12-21 LAB — HEMOGLOBIN A1C: Hgb A1c MFr Bld: 5.3 % (ref 4.6–6.5)

## 2017-12-21 LAB — CBC
HCT: 44.7 % (ref 36.0–46.0)
HEMOGLOBIN: 15 g/dL (ref 12.0–15.0)
MCHC: 33.6 g/dL (ref 30.0–36.0)
MCV: 101 fl — AB (ref 78.0–100.0)
PLATELETS: 197 10*3/uL (ref 150.0–400.0)
RBC: 4.43 Mil/uL (ref 3.87–5.11)
RDW: 11.9 % (ref 11.5–15.5)
WBC: 6.8 10*3/uL (ref 4.0–10.5)

## 2017-12-21 NOTE — Assessment & Plan Note (Addendum)
Congratulated patient on cutting back on her tobacco use.  She is now down to only smoking socially.  Encouraged continued work toward cessation.

## 2017-12-21 NOTE — Assessment & Plan Note (Signed)
Normal exam.  Check TSH.

## 2017-12-21 NOTE — Assessment & Plan Note (Signed)
Prolactin level on last blood draw a few months ago was normal.  We will continue to check periodically.

## 2017-12-21 NOTE — Assessment & Plan Note (Signed)
No red flag signs or symptoms.  Check CBC and CMET.  Referral placed to sports medicine.

## 2017-12-21 NOTE — Patient Instructions (Signed)
It was very nice to see you today!  Keep up the good work.  I am glad that you are cutting down on your cigarettes.  We will check blood work today.  Please come back to see Dr. Paulla Fore soon for your back pain.  We will also check a chest x-ray today.  Come back to see me in 1 year for your next physical, or sooner as needed  Take care, Dr Jerline Pain   Preventive Care 18-39 Years, Female Preventive care refers to lifestyle choices and visits with your health care provider that can promote health and wellness. What does preventive care include?  A yearly physical exam. This is also called an annual well check.  Dental exams once or twice a year.  Routine eye exams. Ask your health care provider how often you should have your eyes checked.  Personal lifestyle choices, including: ? Daily care of your teeth and gums. ? Regular physical activity. ? Eating a healthy diet. ? Avoiding tobacco and drug use. ? Limiting alcohol use. ? Practicing safe sex. ? Taking vitamin and mineral supplements as recommended by your health care provider. What happens during an annual well check? The services and screenings done by your health care provider during your annual well check will depend on your age, overall health, lifestyle risk factors, and family history of disease. Counseling Your health care provider may ask you questions about your:  Alcohol use.  Tobacco use.  Drug use.  Emotional well-being.  Home and relationship well-being.  Sexual activity.  Eating habits.  Work and work Statistician.  Method of birth control.  Menstrual cycle.  Pregnancy history.  Screening You may have the following tests or measurements:  Height, weight, and BMI.  Diabetes screening. This is done by checking your blood sugar (glucose) after you have not eaten for a while (fasting).  Blood pressure.  Lipid and cholesterol levels. These may be checked every 5 years starting at age  83.  Skin check.  Hepatitis C blood test.  Hepatitis B blood test.  Sexually transmitted disease (STD) testing.  BRCA-related cancer screening. This may be done if you have a family history of breast, ovarian, tubal, or peritoneal cancers.  Pelvic exam and Pap test. This may be done every 3 years starting at age 58. Starting at age 58, this may be done every 5 years if you have a Pap test in combination with an HPV test.  Discuss your test results, treatment options, and if necessary, the need for more tests with your health care provider. Vaccines Your health care provider may recommend certain vaccines, such as:  Influenza vaccine. This is recommended every year.  Tetanus, diphtheria, and acellular pertussis (Tdap, Td) vaccine. You may need a Td booster every 10 years.  Varicella vaccine. You may need this if you have not been vaccinated.  HPV vaccine. If you are 71 or younger, you may need three doses over 6 months.  Measles, mumps, and rubella (MMR) vaccine. You may need at least one dose of MMR. You may also need a second dose.  Pneumococcal 13-valent conjugate (PCV13) vaccine. You may need this if you have certain conditions and were not previously vaccinated.  Pneumococcal polysaccharide (PPSV23) vaccine. You may need one or two doses if you smoke cigarettes or if you have certain conditions.  Meningococcal vaccine. One dose is recommended if you are age 24-21 years and a first-year college student living in a residence hall, or if you have one of several  medical conditions. You may also need additional booster doses.  Hepatitis A vaccine. You may need this if you have certain conditions or if you travel or work in places where you may be exposed to hepatitis A.  Hepatitis B vaccine. You may need this if you have certain conditions or if you travel or work in places where you may be exposed to hepatitis B.  Haemophilus influenzae type b (Hib) vaccine. You may need this if  you have certain risk factors.  Talk to your health care provider about which screenings and vaccines you need and how often you need them. This information is not intended to replace advice given to you by your health care provider. Make sure you discuss any questions you have with your health care provider. Document Released: 04/04/2001 Document Revised: 10/27/2015 Document Reviewed: 12/08/2014 Elsevier Interactive Patient Education  [employer].

## 2017-12-21 NOTE — Assessment & Plan Note (Signed)
No red flag signs or symptoms.  Benign abdominal exam.  Reassured patient.  Continue with watchful waiting.

## 2017-12-21 NOTE — Progress Notes (Signed)
Subjective:  Bonnie Parsons is a 37 y.o. female who presents today for her annual comprehensive physical exam.    HPI:  She has no acute complaints today.   She has a few minor problems outlined below 1.  Cough and upper back pain.  Started a few weeks ago.  Has improved over that time.  She has a family history of lung cancer and is a smoker herself.  No specific treatments tried. 2.  Nicotine dependence.  She has been working on cutting down her tobacco intake.  Now only smokes socially. 3.  Back pain.  Several year history.  Located low back.  Has seen chiropractor several times for this last modest improvement. 4.  Abdominal pain.  Upper quadrant.  Present for the past several months.  Comes and goes.  Pain is very mild.  Occasionally feels a nodule to the area.  No nausea or vomiting.  No constipation  Lifestyle Diet: Healthy diet. Does not eat a lot of meat.  Exercise: Goes to gym daily. Runs regularly.   Depression screen [facility] 2/9 11/16/2017  Decreased Interest 0  Down, Depressed, Hopeless 0  PHQ - 2 Score 0  Altered sleeping 0  Tired, decreased energy 0  Change in appetite 0  Feeling bad or failure about yourself  0  Trouble concentrating 0  Moving slowly or fidgety/restless 0  Suicidal thoughts 0  PHQ-9 Score 0    Health Maintenance Due  Topic Date Due  . TETANUS/TDAP  04/20/1997     ROS: Per HPI, otherwise a complete review of systems was negative.   PMH:  The following were reviewed and entered/updated in epic: Past Medical History:  Diagnosis Date  . Thyroid disease    Patient Active Problem List   Diagnosis Date Noted  . Elevated prolactin level (HCC) 12/15/2016  . Nicotine dependence 12/15/2016  . Contraception management 12/15/2016  . Thyroid nodule 12/03/2015  . Vitamin D deficiency 12/03/2015   Past Surgical History:  Procedure Laterality Date  . CESAREAN SECTION      Family History  Problem Relation Age of Onset  . Cancer Father        Lung  . Early death Father   . Hyperlipidemia Paternal Grandmother   . Hypertension Paternal Grandmother   . Thyroid disease Mother   . Asthma Mother   . Breast cancer Neg Hx     Medications- reviewed and updated Current Outpatient Medications  Medication Sig Dispense Refill  . NUVARING 0.12-0.015 MG/24HR vaginal ring PLACE 1 RING EACH VAGINALLY EVERY 28 DAYS 3 each 4   No current facility-administered medications for this visit.     Allergies-reviewed and updated Allergies  Allergen Reactions  . No Known Allergies     Social History   Socioeconomic History  . Marital status: Married    Spouse name: Not on file  . Number of children: Not on file  . Years of education: Not on file  . Highest education level: Not on file  Occupational History  . Not on file  Social Needs  . Financial resource strain: Not on file  . Food insecurity:    Worry: Not on file    Inability: Not on file  . Transportation needs:    Medical: Not on file    Non-medical: Not on file  Tobacco Use  . Smoking status: Current Some Day Smoker    Packs/day: 0.25    Years: 20.00    Pack years: 5.00  Types: Cigarettes  . Smokeless tobacco: Never Used  Substance and Sexual Activity  . Alcohol use: Yes  . Drug use: No  . Sexual activity: Yes    Partners: Male    Birth control/protection: Inserts  Lifestyle  . Physical activity:    Days per week: Not on file    Minutes per session: Not on file  . Stress: Not on file  Relationships  . Social connections:    Talks on phone: Not on file    Gets together: Not on file    Attends religious service: Not on file    Active member of club or organization: Not on file    Attends meetings of clubs or organizations: Not on file    Relationship status: Not on file  Other Topics Concern  . Not on file  Social History Narrative  . Not on file   Objective:  Physical Exam: BP 118/72 (BP Location: Left Arm, Patient Position: Sitting, Cuff Size:  Normal)   Pulse 81   Temp 98.4 F (36.9 C) (Oral)   Ht 5\' 4"  (1.626 m)   Wt 148 lb 6.4 oz (67.3 kg)   SpO2 99%   BMI 25.47 kg/m   Body mass index is 25.47 kg/m. Wt Readings from Last 3 Encounters:  12/21/17 148 lb 6.4 oz (67.3 kg)  11/16/17 155 lb 4.8 oz (70.4 kg)  12/15/16 140 lb 3.2 oz (63.6 kg)   Gen: NAD, resting comfortably HEENT: TMs normal bilaterally. OP clear. No thyromegaly noted.  CV: RRR with no murmurs appreciated Pulm: NWOB, CTAB with no crackles, wheezes, or rhonchi GI: Normal bowel sounds present. Soft, Nontender, Nondistended. MSK: no edema, cyanosis, or clubbing noted Skin: warm, dry Neuro: CN2-12 grossly intact. Strength 5/5 in upper and lower extremities. Reflexes symmetric and intact bilaterally.  Psych: Normal affect and thought content  Assessment/Plan:  No problem-specific Assessment & Plan notes found for this encounter.  Cough/Upper Back Pain No red flag signs or symptoms.  Will check CXR given her smoking history.  Preventative Healthcare: Check lipid panel.  Patient Counseling(The following topics were reviewed and/or handout was given):  -Nutrition: Stressed importance of moderation in sodium/caffeine intake, saturated fat and cholesterol, caloric balance, sufficient intake of fresh fruits, vegetables, and fiber.  -Stressed the importance of regular exercise.   -Substance Abuse: Discussed cessation/primary prevention of tobacco, alcohol, or other drug use; driving or other dangerous activities under the influence; availability of treatment for abuse.   -Injury prevention: Discussed safety belts, safety helmets, smoke detector, smoking near bedding or upholstery.   -Sexuality: Discussed sexually transmitted diseases, partner selection, use of condoms, avoidance of unintended pregnancy and contraceptive alternatives.   -Dental health: Discussed importance of regular tooth brushing, flossing, and dental visits.  -Health maintenance and immunizations  reviewed. Please refer to Health maintenance section.  Return to care in 1 year for next preventative visit.   Katina Degree. [name], MD 12/21/2017 9:31 AM

## 2017-12-21 NOTE — Assessment & Plan Note (Signed)
Check A1c. 

## 2017-12-24 ENCOUNTER — Encounter: Payer: Self-pay | Admitting: Family Medicine

## 2017-12-24 DIAGNOSIS — E785 Hyperlipidemia, unspecified: Secondary | ICD-10-CM | POA: Insufficient documentation

## 2017-12-24 NOTE — Progress Notes (Signed)
Please inform patient of the following:  Her "bad" cholesterol was a bit high but everything else, including her chest xray was normal.  Do not need to make any changes at this point. Would like for her to continue working on diet and exercise and we can recheck in 1 year.  Bonnie Parsons. [name], MD 12/24/2017 9:30 AM

## 2018-10-15 ENCOUNTER — Other Ambulatory Visit: Payer: Self-pay | Admitting: Family Medicine

## 2018-10-15 DIAGNOSIS — Z1231 Encounter for screening mammogram for malignant neoplasm of breast: Secondary | ICD-10-CM

## 2018-11-26 ENCOUNTER — Telehealth: Payer: Self-pay | Admitting: Family Medicine

## 2018-11-26 ENCOUNTER — Other Ambulatory Visit: Payer: Self-pay

## 2018-11-26 DIAGNOSIS — Z3044 Encounter for surveillance of vaginal ring hormonal contraceptive device: Secondary | ICD-10-CM

## 2018-11-26 MED ORDER — NUVARING 0.12-0.015 MG/24HR VA RING: VAGINAL_RING | VAGINAL | 4 refills | Status: DC

## 2018-11-26 NOTE — Telephone Encounter (Signed)
Rx sent to pharmacy   

## 2018-11-26 NOTE — Telephone Encounter (Signed)
See note  Copied from [place] [phone]. Topic: Quick Communication - Rx Refill/Question >> Nov 26, 2018 10:46 AM Carolyn Stare wrote: Medication NUVARING 0.12-0.015 MG/24HR vaginal ring  Preferred Pharmacy RX on file   Agent: Please be advised that RX refills may take up to 3 business days. We ask that you follow-up with your pharmacy.

## 2018-12-27 ENCOUNTER — Encounter: Payer: 59 | Admitting: Family Medicine

## 2019-01-10 ENCOUNTER — Encounter: Payer: Self-pay | Admitting: Family Medicine

## 2019-01-13 ENCOUNTER — Other Ambulatory Visit: Payer: Self-pay

## 2019-01-14 ENCOUNTER — Ambulatory Visit (INDEPENDENT_AMBULATORY_CARE_PROVIDER_SITE_OTHER): Payer: 59 | Admitting: Family Medicine

## 2019-01-14 ENCOUNTER — Other Ambulatory Visit: Payer: Self-pay | Admitting: Family Medicine

## 2019-01-14 ENCOUNTER — Encounter: Payer: Self-pay | Admitting: Family Medicine

## 2019-01-14 VITALS — BP 107/72 | HR 68 | Temp 97.3°F | Ht 64.0 in | Wt 151.0 lb

## 2019-01-14 DIAGNOSIS — E663 Overweight: Secondary | ICD-10-CM

## 2019-01-14 DIAGNOSIS — E785 Hyperlipidemia, unspecified: Secondary | ICD-10-CM | POA: Diagnosis not present

## 2019-01-14 DIAGNOSIS — Z6825 Body mass index (BMI) 25.0-25.9, adult: Secondary | ICD-10-CM

## 2019-01-14 DIAGNOSIS — Z0001 Encounter for general adult medical examination with abnormal findings: Secondary | ICD-10-CM

## 2019-01-14 DIAGNOSIS — F17210 Nicotine dependence, cigarettes, uncomplicated: Secondary | ICD-10-CM | POA: Diagnosis not present

## 2019-01-14 DIAGNOSIS — R739 Hyperglycemia, unspecified: Secondary | ICD-10-CM

## 2019-01-14 DIAGNOSIS — Z3044 Encounter for surveillance of vaginal ring hormonal contraceptive device: Secondary | ICD-10-CM

## 2019-01-14 DIAGNOSIS — R7989 Other specified abnormal findings of blood chemistry: Secondary | ICD-10-CM | POA: Diagnosis not present

## 2019-01-14 DIAGNOSIS — Z23 Encounter for immunization: Secondary | ICD-10-CM | POA: Diagnosis not present

## 2019-01-14 DIAGNOSIS — R059 Cough, unspecified: Secondary | ICD-10-CM

## 2019-01-14 DIAGNOSIS — R05 Cough: Secondary | ICD-10-CM

## 2019-01-14 DIAGNOSIS — E041 Nontoxic single thyroid nodule: Secondary | ICD-10-CM

## 2019-01-14 DIAGNOSIS — M25562 Pain in left knee: Secondary | ICD-10-CM | POA: Insufficient documentation

## 2019-01-14 DIAGNOSIS — Z309 Encounter for contraceptive management, unspecified: Secondary | ICD-10-CM

## 2019-01-14 LAB — LIPID PANEL
Cholesterol: 180 mg/dL (ref 0–200)
HDL: 67.3 mg/dL (ref >39.00)
LDL Cholesterol: 100 mg/dL — ABNORMAL HIGH (ref 0–99)
NonHDL: 112.55
Total CHOL/HDL Ratio: 3
Triglycerides: 65 mg/dL (ref 0.0–149.0)
VLDL: 13 mg/dL (ref 0.0–40.0)

## 2019-01-14 LAB — COMPREHENSIVE METABOLIC PANEL
ALT: 24 U/L (ref 0–35)
AST: 24 U/L (ref 0–37)
Albumin: 4 g/dL (ref 3.5–5.2)
Alkaline Phosphatase: 49 U/L (ref 39–117)
BUN: 11 mg/dL (ref 6–23)
CO2: 27 mEq/L (ref 19–32)
Calcium: 9 mg/dL (ref 8.4–10.5)
Chloride: 102 mEq/L (ref 96–112)
Creatinine, Ser: 0.56 mg/dL (ref 0.40–1.20)
GFR: 119.46 mL/min (ref >60.00)
Glucose, Bld: 79 mg/dL (ref 70–99)
Potassium: 4.2 mEq/L (ref 3.5–5.1)
Sodium: 136 mEq/L (ref 135–145)
Total Bilirubin: 0.7 mg/dL (ref 0.2–1.2)
Total Protein: 6.8 g/dL (ref 6.0–8.3)

## 2019-01-14 LAB — CBC
HCT: 42.3 % (ref 36.0–46.0)
Hemoglobin: 14.3 g/dL (ref 12.0–15.0)
MCHC: 33.8 g/dL (ref 30.0–36.0)
MCV: 101.2 fl — ABNORMAL HIGH (ref 78.0–100.0)
Platelets: 189 10*3/uL (ref 150.0–400.0)
RBC: 4.18 Mil/uL (ref 3.87–5.11)
RDW: 12.4 % (ref 11.5–15.5)
WBC: 7.7 10*3/uL (ref 4.0–10.5)

## 2019-01-14 LAB — TSH: TSH: 1.86 u[IU]/mL (ref 0.35–4.50)

## 2019-01-14 LAB — HEMOGLOBIN A1C: Hgb A1c MFr Bld: 5.2 % (ref 4.6–6.5)

## 2019-01-14 LAB — SARS-COV-2 IGG: SARS-COV-2 IgG: 0.04

## 2019-01-14 MED ORDER — NUVARING 0.12-0.015 MG/24HR VA RING: VAGINAL_RING | VAGINAL | 11 refills | Status: DC

## 2019-01-14 NOTE — Assessment & Plan Note (Signed)
Patient was asked about her tobacco use today and was strongly advised to quit. Patient is currently non contemplative. We reviewed treatment options to assist her quit smoking including NRT, Chantix, and Bupropion. Follow up at next office visit.   Total time spent counseling approximately 3 minutes.   

## 2019-01-14 NOTE — Assessment & Plan Note (Signed)
Check TSH and thyroid ultrasound.

## 2019-01-14 NOTE — Assessment & Plan Note (Signed)
Check A1c. 

## 2019-01-14 NOTE — Assessment & Plan Note (Signed)
Check prolactin level

## 2019-01-14 NOTE — Patient Instructions (Signed)
It was very nice to see you today!  He has little bit of arthritis in your knee.  Please use ice and compression as much as possible.  Please try over-the-counter Voltaren gel.  You can also try over-the-counter supplements including turmeric or glucosamine-chondroitin.  We will check blood work today.  He will get your flu vaccine today.  We will set you up to have an ultrasound of your thyroid.  Come back in 1 year for your next physical, or sooner if needed.  Take care, Dr Jerline Pain  Please try these tips to maintain a healthy lifestyle:   Eat at least 3 REAL meals and 1-2 snacks per day.  Aim for no more than 5 hours between eating.  If you eat breakfast, please do so within one hour of getting up.    Obtain twice as many fruits/vegetables as protein or carbohydrate foods for both lunch and dinner. (Half of each meal should be fruits/vegetables, one quarter protein, and one quarter starchy carbs)   Cut down on sweet beverages. This includes juice, soda, and sweet tea.    Exercise at least 150 minutes every week.    Preventive Care 40-85 Years Old, Female Preventive care refers to visits with your health care provider and lifestyle choices that can promote health and wellness. This includes:  A yearly physical exam. This may also be called an annual well check.  Regular dental visits and eye exams.  Immunizations.  Screening for certain conditions.  Healthy lifestyle choices, such as eating a healthy diet, getting regular exercise, not using drugs or products that contain nicotine and tobacco, and limiting alcohol use. What can I expect for my preventive care visit? Physical exam Your health care provider will check your:  Height and weight. This may be used to calculate body mass index (BMI), which tells if you are at a healthy weight.  Heart rate and blood pressure.  Skin for abnormal spots. Counseling Your health care provider may ask you questions about your:   Alcohol, tobacco, and drug use.  Emotional well-being.  Home and relationship well-being.  Sexual activity.  Eating habits.  Work and work Statistician.  Method of birth control.  Menstrual cycle.  Pregnancy history. What immunizations do I need?  Influenza (flu) vaccine  This is recommended every year. Tetanus, diphtheria, and pertussis (Tdap) vaccine  You may need a Td booster every 10 years. Varicella (chickenpox) vaccine  You may need this if you have not been vaccinated. Zoster (shingles) vaccine  You may need this after age 74. Measles, mumps, and rubella (MMR) vaccine  You may need at least one dose of MMR if you were born in 1957 or later. You may also need a second dose. Pneumococcal conjugate (PCV13) vaccine  You may need this if you have certain conditions and were not previously vaccinated. Pneumococcal polysaccharide (PPSV23) vaccine  You may need one or two doses if you smoke cigarettes or if you have certain conditions. Meningococcal conjugate (MenACWY) vaccine  You may need this if you have certain conditions. Hepatitis A vaccine  You may need this if you have certain conditions or if you travel or work in places where you may be exposed to hepatitis A. Hepatitis B vaccine  You may need this if you have certain conditions or if you travel or work in places where you may be exposed to hepatitis B. Haemophilus influenzae type b (Hib) vaccine  You may need this if you have certain conditions. Human papillomavirus (HPV)  vaccine  If recommended by your health care provider, you may need three doses over 6 months. You may receive vaccines as individual doses or as more than one vaccine together in one shot (combination vaccines). Talk with your health care provider about the risks and benefits of combination vaccines. What tests do I need? Blood tests  Lipid and cholesterol levels. These may be checked every 5 years, or more frequently if you are  over 40 years old.  Hepatitis C test.  Hepatitis B test. Screening  Lung cancer screening. You may have this screening every year starting at age 40 if you have a 30-pack-year history of smoking and currently smoke or have quit within the past 15 years.  Colorectal cancer screening. All adults should have this screening starting at age 40 and continuing until age 75. Your health care provider may recommend screening at age 34 if you are at increased risk. You will have tests every 1-10 years, depending on your results and the type of screening test.  Diabetes screening. This is done by checking your blood sugar (glucose) after you have not eaten for a while (fasting). You may have this done every 1-3 years.  Mammogram. This may be done every 1-2 years. Talk with your health care provider about when you should start having regular mammograms. This may depend on whether you have a family history of breast cancer.  BRCA-related cancer screening. This may be done if you have a family history of breast, ovarian, tubal, or peritoneal cancers.  Pelvic exam and Pap test. This may be done every 3 years starting at age 40. Starting at age 71, this may be done every 5 years if you have a Pap test in combination with an HPV test. Other tests  Sexually transmitted disease (STD) testing.  Bone density scan. This is done to screen for osteoporosis. You may have this scan if you are at high risk for osteoporosis. Follow these instructions at home: Eating and drinking  Eat a diet that includes fresh fruits and vegetables, whole grains, lean protein, and low-fat dairy.  Take vitamin and mineral supplements as recommended by your health care provider.  Do not drink alcohol if: ? Your health care provider tells you not to drink. ? You are pregnant, may be pregnant, or are planning to become pregnant.  If you drink alcohol: ? Limit how much you have to 0-1 drink a day. ? Be aware of how much alcohol  is in your drink. In the [place] one drink equals one 12 oz bottle of beer (355 mL), one 5 oz glass of wine (148 mL), or one 1 oz glass of hard liquor (44 mL). Lifestyle  Take daily care of your teeth and gums.  Stay active. Exercise for at least 30 minutes on 5 or more days each week.  Do not use any products that contain nicotine or tobacco, such as cigarettes, e-cigarettes, and chewing tobacco. If you need help quitting, ask your health care provider.  If you are sexually active, practice safe sex. Use a condom or other form of birth control (contraception) in order to prevent pregnancy and STIs (sexually transmitted infections).  If told by your health care provider, take low-dose aspirin daily starting at age 49. What's next?  Visit your health care provider once a year for a well check visit.  Ask your health care provider how often you should have your eyes and teeth checked.  Stay up to date on all vaccines. This  information is not intended to replace advice given to you by your health care provider. Make sure you discuss any questions you have with your health care provider. Document Released: 03/05/2015 Document Revised: 10/18/2017 Document Reviewed: 10/18/2017 Elsevier Patient Education  2020 [employer].

## 2019-01-14 NOTE — Assessment & Plan Note (Signed)
Check lipid panel, CBC, C met, TSH.

## 2019-01-14 NOTE — Assessment & Plan Note (Signed)
NuvaRing refilled

## 2019-01-14 NOTE — Assessment & Plan Note (Signed)
Likely early osteoarthritis.  Recommended over-the-counter Voltaren gel.  Recommended glucosamine/chondroitin or turmeric.  Recommended compression sleeve and ice as much as possible.

## 2019-01-14 NOTE — Progress Notes (Signed)
Chief Complaint:  Bonnie Parsons is a 38 y.o. female who presents today for her annual comprehensive physical exam.    Assessment/Plan:  Thyroid nodule Check TSH and thyroid ultrasound.  Acute pain of left knee Likely early osteoarthritis.  Recommended over-the-counter Voltaren gel.  Recommended glucosamine/chondroitin or turmeric.  Recommended compression sleeve and ice as much as possible.  Dyslipidemia Check lipid panel, CBC, C met, TSH.  Hyperglycemia Check A1c.  Contraception management NuvaRing refilled.  Nicotine dependence Patient was asked about her tobacco use today and was strongly advised to quit. Patient is currently noncontemplative. We reviewed treatment options to assist her quit smoking including NRT, Chantix, and Bupropion. Follow up at next office visit.   Total time spent counseling approximately 3 minutes.    Elevated prolactin level (HCC) Check prolactin level.  Body mass index is 25.92 kg/m. / Overweight BMI Metric Follow Up - 01/14/19 0833      BMI Metric Follow Up-Please document annually   BMI Metric Follow Up  Education provided       Preventative Healthcare: Check CBC, C met, TSH, and lipid panel.  Flu vaccine given today.  Patient Counseling(The following topics were reviewed and/or handout was given):  -Nutrition: Stressed importance of moderation in sodium/caffeine intake, saturated fat and cholesterol, caloric balance, sufficient intake of fresh fruits, vegetables, and fiber.  -Stressed the importance of regular exercise.   -Substance Abuse: Discussed cessation/primary prevention of tobacco, alcohol, or other drug use; driving or other dangerous activities under the influence; availability of treatment for abuse.   -Injury prevention: Discussed safety belts, safety helmets, smoke detector, smoking near bedding or upholstery.   -Sexuality: Discussed sexually transmitted diseases, partner selection, use of condoms, avoidance of  unintended pregnancy and contraceptive alternatives.   -Dental health: Discussed importance of regular tooth brushing, flossing, and dental visits.  -Health maintenance and immunizations reviewed. Please refer to Health maintenance section.  Return to care in 1 year for next preventative visit.     Subjective:  HPI:  She has no acute complaints today.   She has been dealing with some left knee pain for the last several months.  Comes and goes.  Occasionally swells.  Uses compression sleeve which helps.  No medications tried.  No locking or buckling.  Tries to run every day.   Lifestyle Diet: Plant based. Occasional fish. Plenty of fruits and vegetables.  Exercise: Likes to run and go hiking.   Depression screen PHQ 2/9 01/14/2019  Decreased Interest 0  Down, Depressed, Hopeless 0  PHQ - 2 Score 0  Altered sleeping -  Tired, decreased energy -  Change in appetite -  Feeling bad or failure about yourself  -  Trouble concentrating -  Moving slowly or fidgety/restless -  Suicidal thoughts -  PHQ-9 Score -    Health Maintenance Due  Topic Date Due  . INFLUENZA VACCINE  09/21/2018    ROS: Per HPI, otherwise a complete review of systems was negative.   PMH:  The following were reviewed and entered/updated in epic: Past Medical History:  Diagnosis Date  . Thyroid disease    Patient Active Problem List   Diagnosis Date Noted  . Acute pain of left knee 01/14/2019  . Dyslipidemia 12/24/2017  . Hyperglycemia 12/21/2017  . Elevated prolactin level 12/15/2016  . Nicotine dependence 12/15/2016  . Contraception management 12/15/2016  . Thyroid nodule 12/03/2015   Past Surgical History:  Procedure Laterality Date  . [place]  History  Problem Relation Age of Onset  . Cancer Father        Lung  . Early death Father   . Hyperlipidemia Paternal Grandmother   . Hypertension Paternal Grandmother   . Thyroid disease Mother   . Asthma Mother   . Breast  cancer Neg Hx     Medications- reviewed and updated Current Outpatient Medications  Medication Sig Dispense Refill  . NUVARING 0.12-0.015 MG/24HR vaginal ring PLACE 1 RING EACH VAGINALLY EVERY 28 DAYS 3 each 11   No current facility-administered medications for this visit.     Allergies-reviewed and updated Allergies  Allergen Reactions  . No Known Allergies     Social History   Socioeconomic History  . Marital status: Married    Spouse name: Not on file  . Number of children: Not on file  . Years of education: Not on file  . Highest education level: Not on file  Occupational History  . Not on file  Social Needs  . Financial resource strain: Not on file  . Food insecurity    Worry: Not on file    Inability: Not on file  . Transportation needs    Medical: Not on file    Non-medical: Not on file  Tobacco Use  . Smoking status: Current Some Day Smoker    Packs/day: 0.25    Years: 20.00    Pack years: 5.00    Types: Cigarettes  . Smokeless tobacco: Never Used  Substance and Sexual Activity  . Alcohol use: Yes  . Drug use: No  . Sexual activity: Yes    Partners: Male    Birth control/protection: Inserts  Lifestyle  . Physical activity    Days per week: Not on file    Minutes per session: Not on file  . Stress: Not on file  Relationships  . Social Herbalist on phone: Not on file    Gets together: Not on file    Attends religious service: Not on file    Active member of club or organization: Not on file    Attends meetings of clubs or organizations: Not on file    Relationship status: Not on file  Other Topics Concern  . Not on file  Social History Narrative  . Not on file        Objective:  Physical Exam: BP 107/72   Pulse 68   Temp (!) 97.3 F (36.3 C)   Ht 5' 4" (1.626 m)   Wt 151 lb (68.5 kg)   LMP 12/24/2018 (Approximate)   SpO2 100%   BMI 25.92 kg/m   Body mass index is 25.92 kg/m. Wt Readings from Last 3 Encounters:   01/14/19 151 lb (68.5 kg)  12/21/17 148 lb 6.4 oz (67.3 kg)  11/16/17 155 lb 4.8 oz (70.4 kg)   Gen: NAD, resting comfortably HEENT: TMs normal bilaterally. OP clear. No thyromegaly noted.  CV: RRR with no murmurs appreciated Pulm: NWOB, CTAB with no crackles, wheezes, or rhonchi GI: Normal bowel sounds present. Soft, Nontender, Nondistended. MSK: no edema, cyanosis, or clubbing noted.  Left knee with crepitus on active range of motion.  Stable to varus and valgus stress.  Anterior and posterior drawer signs negative.  No joint line tenderness.  Neurovascular intact distally. Skin: warm, dry Neuro: CN2-12 grossly intact. Strength 5/5 in upper and lower extremities. Reflexes symmetric and intact bilaterally.  Psych: Normal affect and thought content     Shali Vesey M. Jerline Pain, MD  01/14/2019 9:01 AM

## 2019-01-15 LAB — PROLACTIN: Prolactin: 8.5 ng/mL

## 2019-01-15 NOTE — Progress Notes (Signed)
Please inform patient of the following:  Good news! Cholesterol has improved since last year. All of her other labs are NORMAL. Her covid test is negative. Would like for her to keep up the good work and we can recheck in a year or so.  Algis Greenhouse. Jerline Pain, MD 01/15/2019 7:52 AM

## 2019-02-03 IMAGING — DX DG CHEST 2V
2 series · 2 of 2 positions shown · non-contrast
Comparison: Chest x-ray dated December 03, 2015.

CLINICAL DATA: Dry cough for the past 2-3 weeks.

EXAM:
CHEST - 2 VIEW

[chest pa]
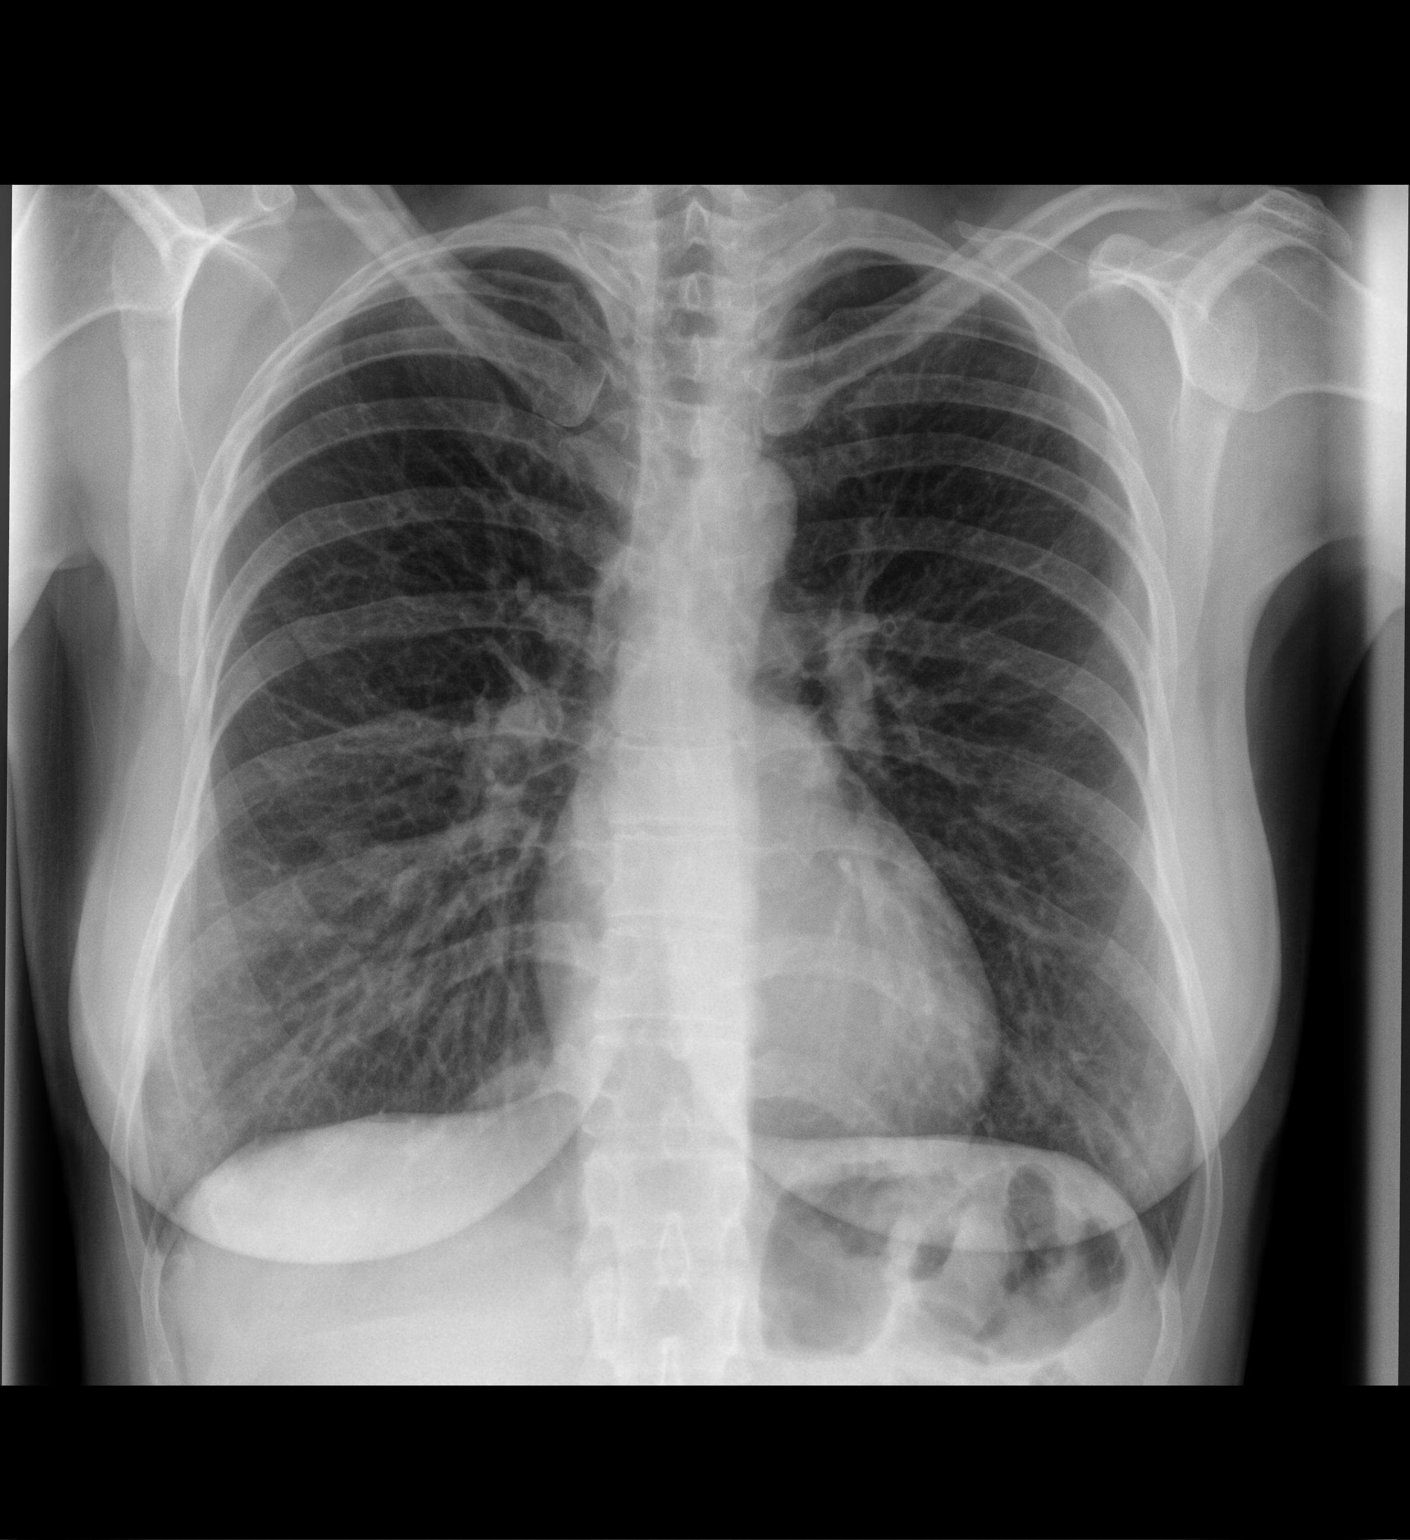

[chest lat]
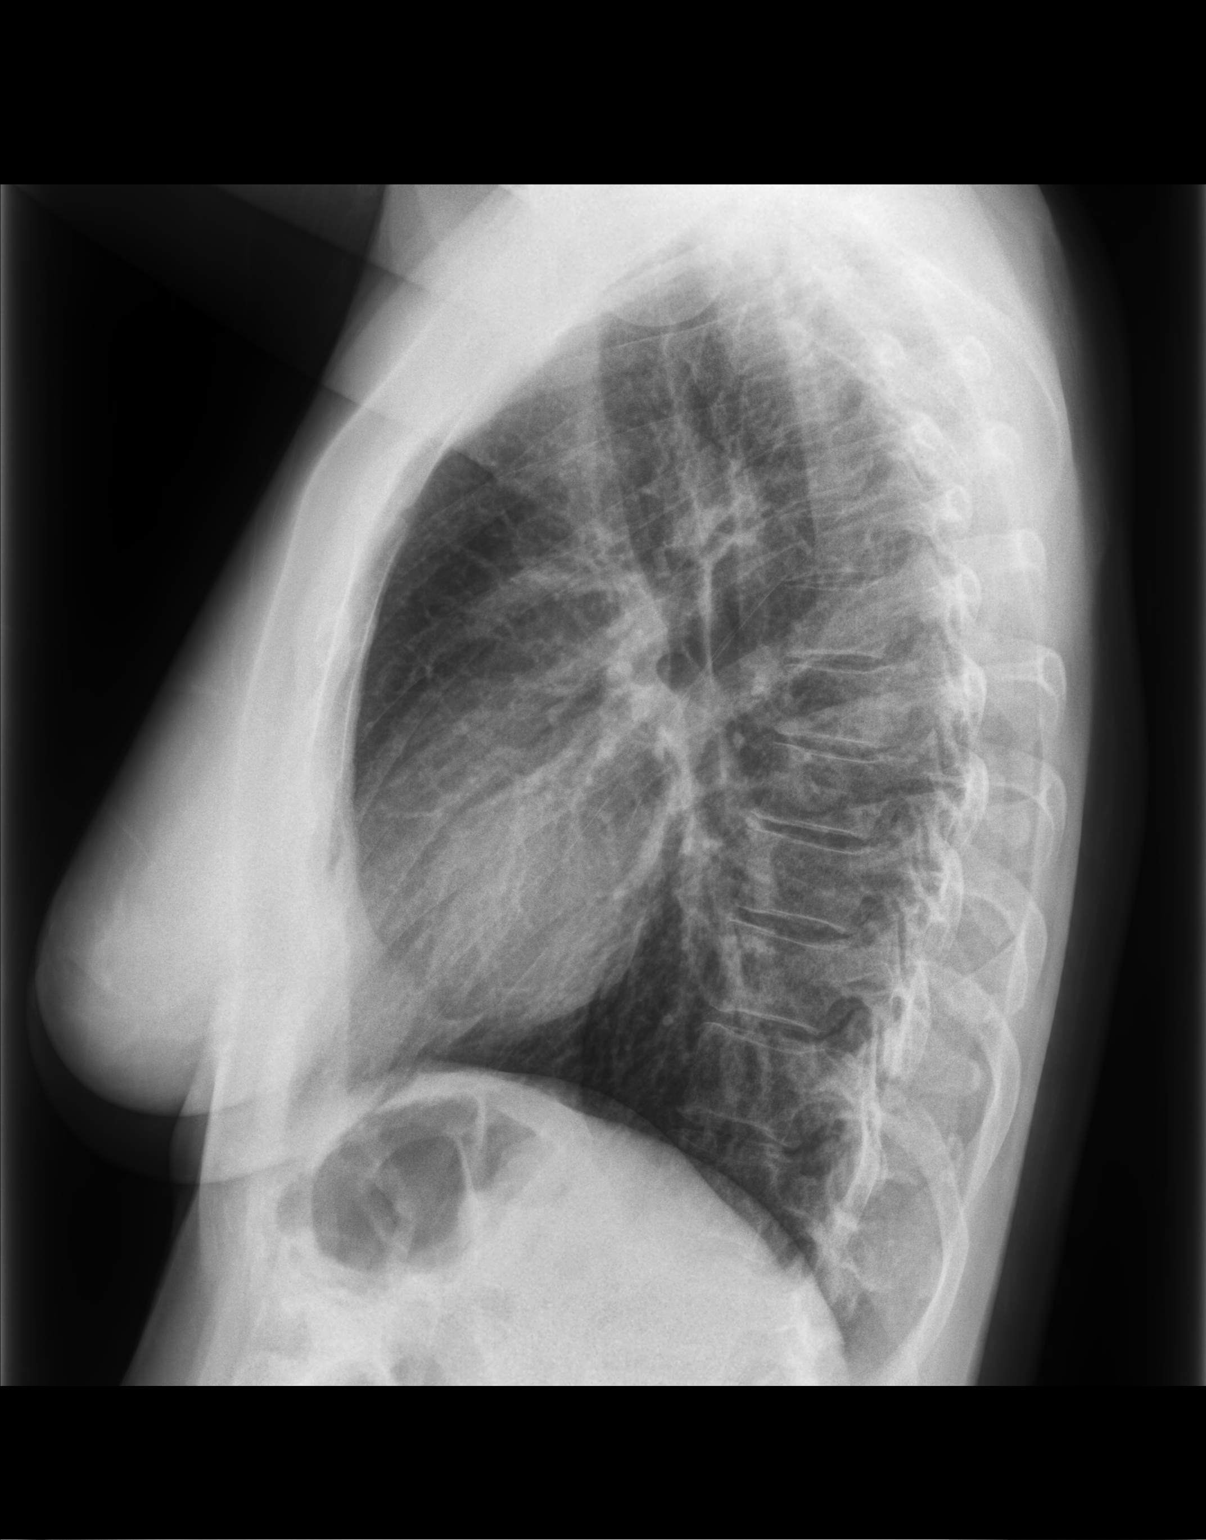

[2 of 2 positions shown; findings below may reference images not displayed]

FINDINGS: The heart size and mediastinal contours are within normal limits.
Both lungs are clear. The visualized skeletal structures are
unremarkable.
IMPRESSION: No active cardiopulmonary disease.

## 2019-02-17 ENCOUNTER — Other Ambulatory Visit: Payer: 59

## 2019-02-28 ENCOUNTER — Ambulatory Visit
Admission: RE | Admit: 2019-02-28 | Discharge: 2019-02-28 | Disposition: A | Discharge status: Home / Self Care | Payer: 59 | Source: Ambulatory Visit | Attending: Family Medicine | Admitting: Family Medicine

## 2019-02-28 DIAGNOSIS — E041 Nontoxic single thyroid nodule: Secondary | ICD-10-CM

## 2019-02-28 DIAGNOSIS — Z0001 Encounter for general adult medical examination with abnormal findings: Secondary | ICD-10-CM

## 2019-03-03 NOTE — Progress Notes (Signed)
Please inform patient of the following:  Ultrasound is NORMAL. Radiology did not find any worrisome nodules. Do not need to perform any further testing at this time.

## 2019-03-24 ENCOUNTER — Other Ambulatory Visit: Payer: Self-pay

## 2019-03-24 ENCOUNTER — Ambulatory Visit
Admission: RE | Admit: 2019-03-24 | Discharge: 2019-03-24 | Disposition: A | Discharge status: Home / Self Care | Payer: 59 | Source: Ambulatory Visit | Attending: Family Medicine | Admitting: Family Medicine

## 2019-03-24 DIAGNOSIS — Z1231 Encounter for screening mammogram for malignant neoplasm of breast: Secondary | ICD-10-CM

## 2019-06-05 ENCOUNTER — Other Ambulatory Visit: Payer: Self-pay

## 2019-06-06 ENCOUNTER — Encounter: Payer: Self-pay | Admitting: Family Medicine

## 2019-06-06 ENCOUNTER — Ambulatory Visit (INDEPENDENT_AMBULATORY_CARE_PROVIDER_SITE_OTHER): Payer: 59

## 2019-06-06 ENCOUNTER — Ambulatory Visit (INDEPENDENT_AMBULATORY_CARE_PROVIDER_SITE_OTHER): Payer: 59 | Admitting: Family Medicine

## 2019-06-06 VITALS — BP 124/72 | HR 76 | Temp 98.0°F | Ht 64.0 in | Wt 151.5 lb

## 2019-06-06 DIAGNOSIS — G8929 Other chronic pain: Secondary | ICD-10-CM

## 2019-06-06 DIAGNOSIS — R109 Unspecified abdominal pain: Secondary | ICD-10-CM | POA: Diagnosis not present

## 2019-06-06 DIAGNOSIS — M546 Pain in thoracic spine: Secondary | ICD-10-CM

## 2019-06-06 NOTE — Patient Instructions (Signed)
It was very nice to see you today!  He has some signs of arthritis in your back.  I think this is what is causing most of your symptoms.  Please work on exercises.  Continue to heating pad if needed.  Take care, Dr [name]  Please try these tips to maintain a healthy lifestyle:   Eat at least 3 REAL meals and 1-2 snacks per day.  Aim for no more than 5 hours between eating.  If you eat breakfast, please do so within one hour of getting up.    Each meal should contain half fruits/vegetables, one quarter protein, and one quarter carbs (no bigger than a computer mouse)   Cut down on sweet beverages. This includes juice, soda, and sweet tea.     Drink at least 1 glass of water with each meal and aim for at least 8 glasses per day   Exercise at least 150 minutes every week.

## 2019-06-06 NOTE — Progress Notes (Signed)
   Bonnie Parsons is a 39 y.o. female who presents today for an office visit.  Assessment/Plan:  New/Acute Problems: Right thoracic back pain X-ray obtained today.  Has some mild degenerative changes based on my read.  Will await radiology read.  Also has some muscular tenderness.  She does not want prescription medications at this point.  Recommended heating pad as needed.  Can use over-the-counter meds such as Tylenol or ibuprofen as needed.  Discussed exercises handout was given.  If no improvement may need referral to sports medicine.    Subjective:  HPI:   Patient with right upper back pain for the past several months.  Symptoms are mild.  Feels like a pressure sensation.  She has been active.  No obvious injuries or precipitating events.  No hematuria.  No fevers or chills.  No nausea or vomiting.  No specific treatments tried.        Objective:  Physical Exam: BP 124/72   Pulse 76   Temp 98 F (36.7 C)   Ht 5\' 4"  (1.626 m)   Wt 151 lb 8 oz (68.7 kg)   LMP 06/04/2019   SpO2 100%   BMI 26.00 kg/m   Gen: No acute distress, resting comfortably CV: Regular rate and rhythm with no murmurs appreciated Pulm: Normal work of breathing, clear to auscultation bilaterally with no crackles, wheezes, or rhonchi MSK: Mildly tender to palpation along right paraspinal thoracic muscles. Neuro: Grossly normal, moves all extremities Psych: Normal affect and thought content      Infant Zink M. 06/06/2019, MD 06/06/2019 11:43 AM

## 2019-06-09 NOTE — Progress Notes (Signed)
Please inform patient of the following:  Radiology reviewed her xray and did not find any other abnormalities. Would like for her to let us know if her pain worsens and we can refer her to see sports medicine or PT.  Bonnie Parsons. [name], MD 06/09/2019 8:09 AM

## 2020-01-23 ENCOUNTER — Other Ambulatory Visit: Payer: Self-pay

## 2020-01-23 ENCOUNTER — Ambulatory Visit (INDEPENDENT_AMBULATORY_CARE_PROVIDER_SITE_OTHER): Payer: 59 | Admitting: Family Medicine

## 2020-01-23 ENCOUNTER — Encounter: Payer: Self-pay | Admitting: Family Medicine

## 2020-01-23 VITALS — BP 106/71 | HR 78 | Temp 98.6°F | Ht 64.0 in | Wt 152.2 lb

## 2020-01-23 DIAGNOSIS — M25562 Pain in left knee: Secondary | ICD-10-CM | POA: Diagnosis not present

## 2020-01-23 DIAGNOSIS — Z0001 Encounter for general adult medical examination with abnormal findings: Secondary | ICD-10-CM

## 2020-01-23 DIAGNOSIS — F17210 Nicotine dependence, cigarettes, uncomplicated: Secondary | ICD-10-CM

## 2020-01-23 DIAGNOSIS — E785 Hyperlipidemia, unspecified: Secondary | ICD-10-CM

## 2020-01-23 DIAGNOSIS — R7989 Other specified abnormal findings of blood chemistry: Secondary | ICD-10-CM

## 2020-01-23 DIAGNOSIS — Z6826 Body mass index (BMI) 26.0-26.9, adult: Secondary | ICD-10-CM

## 2020-01-23 DIAGNOSIS — R739 Hyperglycemia, unspecified: Secondary | ICD-10-CM | POA: Diagnosis not present

## 2020-01-23 DIAGNOSIS — Z3044 Encounter for surveillance of vaginal ring hormonal contraceptive device: Secondary | ICD-10-CM

## 2020-01-23 DIAGNOSIS — Z23 Encounter for immunization: Secondary | ICD-10-CM

## 2020-01-23 DIAGNOSIS — E663 Overweight: Secondary | ICD-10-CM

## 2020-01-23 DIAGNOSIS — Z1211 Encounter for screening for malignant neoplasm of colon: Secondary | ICD-10-CM

## 2020-01-23 DIAGNOSIS — G8929 Other chronic pain: Secondary | ICD-10-CM

## 2020-01-23 MED ORDER — CHANTIX STARTING MONTH PAK 0.5 MG X 11 & 1 MG X 42 PO TABS: ORAL_TABLET | ORAL | 0 refills | Status: DC

## 2020-01-23 MED ORDER — VARENICLINE TARTRATE 1 MG PO TABS: 1.0000 mg | ORAL_TABLET | Freq: Two times a day (BID) | ORAL | 5 refills | Status: DC

## 2020-01-23 MED ORDER — NUVARING 0.12-0.015 MG/24HR VA RING: VAGINAL_RING | VAGINAL | 11 refills | Status: DC

## 2020-01-23 NOTE — Assessment & Plan Note (Signed)
Patient was asked about her tobacco use today and was strongly advised to quit. Patient is currently contemplative. We reviewed treatment options to assist her quit smoking including NRT, Chantix, and Bupropion. Follow up at next office visit.  Will start Chantix today.  Total time spent counseling approximately 3 minutes.

## 2020-01-23 NOTE — Assessment & Plan Note (Signed)
Refilled NuvaRing today.

## 2020-01-23 NOTE — Addendum Note (Signed)
Addended by: Dyann Kief on: 01/23/2020 10:49 AM   Modules accepted: Orders

## 2020-01-23 NOTE — Assessment & Plan Note (Signed)
Chart prolactin.

## 2020-01-23 NOTE — Assessment & Plan Note (Signed)
Check A1c.  She has been monitoring at home and postprandials will sometimes spike to near 200.

## 2020-01-23 NOTE — Assessment & Plan Note (Signed)
Likely osteoarthritis.  Has been going on for over a year and still persistent despite over-the-counter meds.  No red flags.  Will place referral to sports medicine for further evaluation.

## 2020-01-23 NOTE — Assessment & Plan Note (Signed)
Check lipid panel.  Continue lifestyle modifications. 

## 2020-01-23 NOTE — Progress Notes (Signed)
Chief Complaint:  Bonnie Parsons is a 39 y.o. female who presents today for her annual comprehensive physical exam.    Assessment/Plan:  Chronic Problems Addressed Today: Chronic pain of left knee Likely osteoarthritis.  Has been going on for over a year and still persistent despite over-the-counter meds.  No red flags.  Will place referral to sports medicine for further evaluation.  Dyslipidemia Check lipid panel.  Continue lifestyle modifications.  Hyperglycemia Check A1c.  She has been monitoring at home and postprandials will sometimes spike to near 200.  Contraception management Refilled NuvaRing today.  Nicotine dependence Patient was asked about her tobacco use today and was strongly advised to quit. Patient is currently contemplative. We reviewed treatment options to assist her quit smoking including NRT, Chantix, and Bupropion. Follow up at next office visit.  Will start Chantix today.  Total time spent counseling approximately 3 minutes.    Elevated prolactin level (HCC) Chart prolactin.   Body mass index is 26.13 kg/m. / Overweight  BMI Metric Follow Up - 01/23/20 0903      BMI Metric Follow Up-Please document annually   BMI Metric Follow Up Education provided            Preventative Healthcare: Tdap and flu vaccine given today.  Check CBC, CMET, TSH, A1c, and lipid panel.  Check chest x-ray.  She is interested in colon cancer screening but is not eligible due to age.  Will check FOBT.  Patient Counseling(The following topics were reviewed and/or handout was given):  -Nutrition: Stressed importance of moderation in sodium/caffeine intake, saturated fat and cholesterol, caloric balance, sufficient intake of fresh fruits, vegetables, and fiber.  -Stressed the importance of regular exercise.   -Substance Abuse: Discussed cessation/primary prevention of tobacco, alcohol, or other drug use; driving or other dangerous activities under the influence;  availability of treatment for abuse.   -Injury prevention: Discussed safety belts, safety helmets, smoke detector, smoking near bedding or upholstery.   -Sexuality: Discussed sexually transmitted diseases, partner selection, use of condoms, avoidance of unintended pregnancy and contraceptive alternatives.   -Dental health: Discussed importance of regular tooth brushing, flossing, and dental visits.  -Health maintenance and immunizations reviewed. Please refer to Health maintenance section.  Return to care in 1 year for next preventative visit.     Subjective:  HPI:  She has no acute complaints today.   Lifestyle Diet: Balanced. Lots of vegetables.  Exercise: Runs 2-3 times per week. Does a lot of walking.   Depression screen PHQ 2/9 01/23/2020  Decreased Interest 0  Down, Depressed, Hopeless 0  PHQ - 2 Score 0  Altered sleeping -  Tired, decreased energy -  Change in appetite -  Feeling bad or failure about yourself  -  Trouble concentrating -  Moving slowly or fidgety/restless -  Suicidal thoughts -  PHQ-9 Score -   There are no preventive care reminders to display for this patient.  ROS: Per HPI, otherwise a complete review of systems was negative.   PMH:  The following were reviewed and entered/updated in epic: Past Medical History:  Diagnosis Date  . Thyroid disease    Patient Active Problem List   Diagnosis Date Noted  . Chronic pain of left knee 01/23/2020  . Dyslipidemia 12/24/2017  . Hyperglycemia 12/21/2017  . Elevated prolactin level 12/15/2016  . Nicotine dependence 12/15/2016  . Contraception management 12/15/2016  . Thyroid nodule 12/03/2015   Past Surgical History:  Procedure Laterality Date  . CESAREAN SECTION  Family History  Problem Relation Age of Onset  . Cancer Father        Lung  . Early death Father   . Hyperlipidemia Paternal Grandmother   . Hypertension Paternal Grandmother   . Thyroid disease Mother   . Asthma Mother   .  Breast cancer Neg Hx     Medications- reviewed and updated Current Outpatient Medications  Medication Sig Dispense Refill  . NUVARING 0.12-0.015 MG/24HR vaginal ring PLACE 1 RING EACH VAGINALLY EVERY 28 DAYS 3 each 11  . varenicline (CHANTIX STARTING MONTH PAK) 0.5 MG X 11 & 1 MG X 42 tablet Take one 0.5 mg tablet by mouth once daily for 3 days, then increase to one 0.5 mg tablet twice daily for 4 days, then increase to one 1 mg tablet twice daily. 53 tablet 0  . varenicline (CHANTIX) 1 MG tablet Take 1 tablet (1 mg total) by mouth 2 (two) times daily. 60 tablet 5   No current facility-administered medications for this visit.    Allergies-reviewed and updated Allergies  Allergen Reactions  . No Known Allergies     Social History   Socioeconomic History  . Marital status: Married    Spouse name: Not on file  . Number of children: Not on file  . Years of education: Not on file  . Highest education level: Not on file  Occupational History  . Not on file  Tobacco Use  . Smoking status: Current Some Day Smoker    Packs/day: 0.25    Years: 20.00    Pack years: 5.00    Types: Cigarettes  . Smokeless tobacco: Never Used  Vaping Use  . Vaping Use: Never used  Substance and Sexual Activity  . Alcohol use: Yes  . Drug use: No  . Sexual activity: Yes    Partners: Male    Birth control/protection: Inserts  Other Topics Concern  . Not on file  Social History Narrative  . Not on file   Social Determinants of Health   Financial Resource Strain:   . Difficulty of Paying Living Expenses: Not on file  Food Insecurity:   . Worried About Programme researcher, broadcasting/film/video in the Last Year: Not on file  . Ran Out of Food in the Last Year: Not on file  Transportation Needs:   . Lack of Transportation (Medical): Not on file  . Lack of Transportation (Non-Medical): Not on file  Physical Activity:   . Days of Exercise per Week: Not on file  . Minutes of Exercise per Session: Not on file  Stress:    . Feeling of Stress : Not on file  Social Connections:   . Frequency of Communication with Friends and Family: Not on file  . Frequency of Social Gatherings with Friends and Family: Not on file  . Attends Religious Services: Not on file  . Active Member of Clubs or Organizations: Not on file  . Attends Banker Meetings: Not on file  . Marital Status: Not on file        Objective:  Physical Exam: BP 106/71   Pulse 78   Temp 98.6 F (37 C) (Temporal)   Ht 5\' 4"  (1.626 m)   Wt 152 lb 3.2 oz (69 kg)   LMP 01/14/2020   SpO2 98%   BMI 26.13 kg/m   Body mass index is 26.13 kg/m. Wt Readings from Last 3 Encounters:  01/23/20 152 lb 3.2 oz (69 kg)  06/06/19 151 lb 8 oz (  68.7 kg)  01/14/19 151 lb (68.5 kg)   Gen: NAD, resting comfortably HEENT: TMs normal bilaterally. OP clear. No thyromegaly noted.  CV: RRR with no murmurs appreciated Pulm: NWOB, CTAB with no crackles, wheezes, or rhonchi GI: Normal bowel sounds present. Soft, Nontender, Nondistended. MSK: no edema, cyanosis, or clubbing noted Skin: warm, dry Neuro: CN2-12 grossly intact. Strength 5/5 in upper and lower extremities. Reflexes symmetric and intact bilaterally.  Psych: Normal affect and thought content     Indie Nickerson M. [name], MD 01/23/2020 9:06 AM

## 2020-01-23 NOTE — Patient Instructions (Signed)
It was very nice to see you today!  We will check blood work today.  We will give you a flu vaccine and tetanus vaccine today.  I am glad that you are trying to cut down on smoking.  I will send in a prescription of Chantix.  Please let me know if you have any about side effects.  We will check stool cards to screen for colon cancer.  You can get your chest x-ray done at the [facility] office or you can come back here in a couple weeks to have it done.  I will see you back in year for your next physical.  Please come back to see me sooner if needed.  Take care, Dr Jerline Pain  Please try these tips to maintain a healthy lifestyle:   Eat at least 3 REAL meals and 1-2 snacks per day.  Aim for no more than 5 hours between eating.  If you eat breakfast, please do so within one hour of getting up.    Each meal should contain half fruits/vegetables, one quarter protein, and one quarter carbs (no bigger than a computer mouse)   Cut down on sweet beverages. This includes juice, soda, and sweet tea.     Drink at least 1 glass of water with each meal and aim for at least 8 glasses per day   Exercise at least 150 minutes every week.    Preventive Care 65-77 Years Old, Female Preventive care refers to visits with your health care provider and lifestyle choices that can promote health and wellness. This includes:  A yearly physical exam. This may also be called an annual well check.  Regular dental visits and eye exams.  Immunizations.  Screening for certain conditions.  Healthy lifestyle choices, such as eating a healthy diet, getting regular exercise, not using drugs or products that contain nicotine and tobacco, and limiting alcohol use. What can I expect for my preventive care visit? Physical exam Your health care provider will check your:  Height and weight. This may be used to calculate body mass index (BMI), which tells if you are at a healthy weight.  Heart rate and blood  pressure.  Skin for abnormal spots. Counseling Your health care provider may ask you questions about your:  Alcohol, tobacco, and drug use.  Emotional well-being.  Home and relationship well-being.  Sexual activity.  Eating habits.  Work and work Statistician.  Method of birth control.  Menstrual cycle.  Pregnancy history. What immunizations do I need?  Influenza (flu) vaccine  This is recommended every year. Tetanus, diphtheria, and pertussis (Tdap) vaccine  You may need a Td booster every 10 years. Varicella (chickenpox) vaccine  You may need this if you have not been vaccinated. Zoster (shingles) vaccine  You may need this after age 14. Measles, mumps, and rubella (MMR) vaccine  You may need at least one dose of MMR if you were born in 1957 or later. You may also need a second dose. Pneumococcal conjugate (PCV13) vaccine  You may need this if you have certain conditions and were not previously vaccinated. Pneumococcal polysaccharide (PPSV23) vaccine  You may need one or two doses if you smoke cigarettes or if you have certain conditions. Meningococcal conjugate (MenACWY) vaccine  You may need this if you have certain conditions. Hepatitis A vaccine  You may need this if you have certain conditions or if you travel or work in places where you may be exposed to hepatitis A. Hepatitis B vaccine  You may need this if you have certain conditions or if you travel or work in places where you may be exposed to hepatitis B. Haemophilus influenzae type b (Hib) vaccine  You may need this if you have certain conditions. Human papillomavirus (HPV) vaccine  If recommended by your health care provider, you may need three doses over 6 months. You may receive vaccines as individual doses or as more than one vaccine together in one shot (combination vaccines). Talk with your health care provider about the risks and benefits of combination vaccines. What tests do I  need? Blood tests  Lipid and cholesterol levels. These may be checked every 5 years, or more frequently if you are over 70 years old.  Hepatitis C test.  Hepatitis B test. Screening  Lung cancer screening. You may have this screening every year starting at age 52 if you have a 30-pack-year history of smoking and currently smoke or have quit within the past 15 years.  Colorectal cancer screening. All adults should have this screening starting at age 34 and continuing until age 12. Your health care provider may recommend screening at age 28 if you are at increased risk. You will have tests every 1-10 years, depending on your results and the type of screening test.  Diabetes screening. This is done by checking your blood sugar (glucose) after you have not eaten for a while (fasting). You may have this done every 1-3 years.  Mammogram. This may be done every 1-2 years. Talk with your health care provider about when you should start having regular mammograms. This may depend on whether you have a family history of breast cancer.  BRCA-related cancer screening. This may be done if you have a family history of breast, ovarian, tubal, or peritoneal cancers.  Pelvic exam and Pap test. This may be done every 3 years starting at age 103. Starting at age 75, this may be done every 5 years if you have a Pap test in combination with an HPV test. Other tests  Sexually transmitted disease (STD) testing.  Bone density scan. This is done to screen for osteoporosis. You may have this scan if you are at high risk for osteoporosis. Follow these instructions at home: Eating and drinking  Eat a diet that includes fresh fruits and vegetables, whole grains, lean protein, and low-fat dairy.  Take vitamin and mineral supplements as recommended by your health care provider.  Do not drink alcohol if: ? Your health care provider tells you not to drink. ? You are pregnant, may be pregnant, or are planning to  become pregnant.  If you drink alcohol: ? Limit how much you have to 0-1 drink a day. ? Be aware of how much alcohol is in your drink. In the [place] one drink equals one 12 oz bottle of beer (355 mL), one 5 oz glass of wine (148 mL), or one 1 oz glass of hard liquor (44 mL). Lifestyle  Take daily care of your teeth and gums.  Stay active. Exercise for at least 30 minutes on 5 or more days each week.  Do not use any products that contain nicotine or tobacco, such as cigarettes, e-cigarettes, and chewing tobacco. If you need help quitting, ask your health care provider.  If you are sexually active, practice safe sex. Use a condom or other form of birth control (contraception) in order to prevent pregnancy and STIs (sexually transmitted infections).  If told by your health care provider, take low-dose aspirin daily starting  at age 35. What's next?  Visit your health care provider once a year for a well check visit.  Ask your health care provider how often you should have your eyes and teeth checked.  Stay up to date on all vaccines. This information is not intended to replace advice given to you by your health care provider. Make sure you discuss any questions you have with your health care provider. Document Revised: 10/18/2017 Document Reviewed: 10/18/2017 Elsevier Patient Education  2020 [employer].

## 2020-01-24 LAB — PROLACTIN: Prolactin: 6.2 ng/mL

## 2020-01-24 LAB — COMPREHENSIVE METABOLIC PANEL
AG Ratio: 1.4 (calc) (ref 1.0–2.5)
ALT: 16 U/L (ref 6–29)
AST: 19 U/L (ref 10–30)
Albumin: 4 g/dL (ref 3.6–5.1)
Alkaline phosphatase (APISO): 53 U/L (ref 31–125)
BUN: 9 mg/dL (ref 7–25)
CO2: 25 mmol/L (ref 20–32)
Calcium: 8.9 mg/dL (ref 8.6–10.2)
Chloride: 105 mmol/L (ref 98–110)
Creat: 0.54 mg/dL (ref 0.50–1.10)
Globulin: 2.8 g/dL (calc) (ref 1.9–3.7)
Glucose, Bld: 100 mg/dL — ABNORMAL HIGH (ref 65–99)
Potassium: 4.3 mmol/L (ref 3.5–5.3)
Sodium: 139 mmol/L (ref 135–146)
Total Bilirubin: 0.8 mg/dL (ref 0.2–1.2)
Total Protein: 6.8 g/dL (ref 6.1–8.1)

## 2020-01-24 LAB — CBC
HCT: 39.4 % (ref 35.0–45.0)
Hemoglobin: 13.5 g/dL (ref 11.7–15.5)
MCH: 34.1 pg — ABNORMAL HIGH (ref 27.0–33.0)
MCHC: 34.3 g/dL (ref 32.0–36.0)
MCV: 99.5 fL (ref 80.0–100.0)
MPV: 11 fL (ref 7.5–12.5)
Platelets: 195 10*3/uL (ref 140–400)
RBC: 3.96 10*6/uL (ref 3.80–5.10)
RDW: 10.9 % — ABNORMAL LOW (ref 11.0–15.0)
WBC: 7.3 10*3/uL (ref 3.8–10.8)

## 2020-01-24 LAB — LIPID PANEL
Cholesterol: 177 mg/dL (ref <200)
HDL: 68 mg/dL (ref >=50)
LDL Cholesterol (Calc): 91 mg/dL (calc)
Non-HDL Cholesterol (Calc): 109 mg/dL (calc) (ref <130)
Total CHOL/HDL Ratio: 2.6 (calc) (ref <5.0)
Triglycerides: 88 mg/dL (ref <150)

## 2020-01-24 LAB — HEMOGLOBIN A1C
Hgb A1c MFr Bld: 5.2 % of total Hgb (ref <5.7)
Mean Plasma Glucose: 103 (calc)
eAG (mmol/L): 5.7 (calc)

## 2020-01-24 LAB — TSH: TSH: 2.09 mIU/L

## 2020-01-26 NOTE — Progress Notes (Signed)
Subjective:    I'm seeing this patient as a consultation for:  Dr. [name]. Note will be routed back to referring provider/PCP.  CC: L knee pain  I, Molly Weber, LAT, ATC, am serving as scribe for Dr. Clementeen Graham.  HPI: Pt is a 39 y/o female presenting w/ c/o L knee pain x one year w/ no known MOI.  She locates her pain to her L anterior knee and sometimes posterior.  She notes some swelling occasionally with it.  She occasionally has tried some compression sleeve which is helped a little.  L knee swelling: yes, after running L knee mechanical symptoms: grinding w/ weight bearing activity Aggravating factors: running; prolonged walking Treatments tried: knee sleeve while running; ice   Past medical history, Surgical history, Family history, Social history, Allergies, and medications have been entered into the medical record, reviewed.   Review of Systems: No new headache, visual changes, nausea, vomiting, diarrhea, constipation, dizziness, abdominal pain, skin rash, fevers, chills, night sweats, weight loss, swollen lymph nodes, body aches, joint swelling, muscle aches, chest pain, shortness of breath, mood changes, visual or auditory hallucinations.   Objective:    Vitals:   01/27/20 0829  BP: 94/62  Pulse: 69  SpO2: 97%   General: Well Developed, well nourished, and in no acute distress.  Neuro/Psych: Alert and oriented x3, extra-ocular muscles intact, able to move all 4 extremities, sensation grossly intact. Skin: Warm and dry, no rashes noted.  Respiratory: Not using accessory muscles, speaking in full sentences, trachea midline.  Cardiovascular: Pulses palpable, no extremity edema. Abdomen: Does not appear distended. MSK: Left knee normal-appearing Normal motion without crepitation. Not particularly tender. Stable ligamentous exam. Intact strength. Left hip normal.  Normal motion nontender normal strength.  Lab and Radiology Results  X-ray images left knee obtained  today personally and independently interpreted Minimal degenerative changes.  No acute fractures. Await formal radiology review  Diagnostic Limited MSK Ultrasound of: Left knee Quad tendon intact normal-appearing No significant joint effusion. Patellar tendon normal-appearing Medial joint line normal-appearing Lateral joint line slightly degenerative appearing lateral meniscus. Posterior knee no Baker's cyst Impression: Degenerative lateral meniscus appearance   Impression and Recommendations:    Assessment and Plan: 39 y.o. 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  Exercises reviewed with ATC in clinic today.  Plan for VMO strengthening and continued hip abduction strengthening.  Also recommend Voltaren gel and compressive knee sleeve.  If not improving would proceed with PT referral.  Consider also injection versus MRI in the future if needed.  PDMP not reviewed this encounter. Orders Placed This Encounter  Procedures  . [place] LIMITED JOINT SPACE STRUCTURES LOW LEFT(NO LINKED CHARGES)    Order Specific Question:   Reason for Exam (SYMPTOM  OR DIAGNOSIS REQUIRED)    Answer:   L knee pain    Order Specific Question:   Preferred imaging location?    Answer:   Adult nurse [facility]  . DG Knee AP/LAT W/Sunrise Left    Standing Status:   Future    Number of Occurrences:   1    Standing Expiration Date:   02/27/2020    Order Specific Question:   Reason for Exam (SYMPTOM  OR DIAGNOSIS REQUIRED)    Answer:   L knee pain    Order Specific Question:   Is patient pregnant?    Answer:   No    Order Specific Question:   Preferred imaging location?  Answer:   [facility]   No orders of the defined types were placed in this encounter.   Discussed warning signs or symptoms. Please see discharge instructions. Patient expresses understanding.   The above documentation has been reviewed and is accurate and complete Clementeen Graham, M.D.

## 2020-01-26 NOTE — Progress Notes (Signed)
Please inform patient of the following:  Her labs are all within expected ranges. Would like for her to keep up the good work with diet and exercise and we can recheck in a year or so.  Bonnie Parsons. [name], MD 01/26/2020 8:10 AM

## 2020-01-27 ENCOUNTER — Other Ambulatory Visit: Payer: Self-pay

## 2020-01-27 ENCOUNTER — Encounter: Payer: Self-pay | Admitting: Family Medicine

## 2020-01-27 ENCOUNTER — Ambulatory Visit (INDEPENDENT_AMBULATORY_CARE_PROVIDER_SITE_OTHER): Payer: 59

## 2020-01-27 ENCOUNTER — Ambulatory Visit (INDEPENDENT_AMBULATORY_CARE_PROVIDER_SITE_OTHER): Payer: 59 | Admitting: Family Medicine

## 2020-01-27 ENCOUNTER — Ambulatory Visit: Payer: Self-pay

## 2020-01-27 VITALS — BP 94/62 | HR 69 | Ht 64.0 in | Wt 155.0 lb

## 2020-01-27 DIAGNOSIS — M25562 Pain in left knee: Secondary | ICD-10-CM | POA: Diagnosis not present

## 2020-01-27 DIAGNOSIS — G8929 Other chronic pain: Secondary | ICD-10-CM

## 2020-01-27 DIAGNOSIS — M222X2 Patellofemoral disorders, left knee: Secondary | ICD-10-CM

## 2020-01-27 NOTE — Patient Instructions (Addendum)
Thank you for coming in today.  Please use voltaren gel up to 4x daily for pain as needed.   Use the compression sleeve during exercise and for 30-60 mins following exercise.   Do the exercises Molly reviewed with you.   Please perform the exercise program that we have prepared for you and gone over in detail on a daily basis.  In addition to the handout you were provided you can access your program through: [email or web address]   [email or web address] Your unique program code is:    If not improving let me know and I will refer to PT.   Please get an Xray today before you leave    Patellofemoral Pain Syndrome  Patellofemoral pain syndrome is a condition in which the tissue (cartilage) on the underside of the kneecap (patella) softens or breaks down. This causes pain in the front of the knee. The condition is also called runner's knee or chondromalacia patella. Patellofemoral pain syndrome is most common in young adults who are active in sports. The knee is the largest joint in the body. The patella covers the front of the knee and is attached to muscles above and below the knee. The underside of the patella is covered with a smooth type of cartilage (synovium). The smooth surface helps the patella to glide easily when you move your knee. Patellofemoral pain syndrome causes swelling in the joint linings and bone surfaces in the knee. What are the causes? This condition may be caused by:  Overuse of the knee.  Poor alignment of your knee joints.  Weak leg muscles.  A direct blow to your kneecap. What increases the risk? You are more likely to develop this condition if:  You do a lot of activities that can wear down your kneecap. These include: ? Running. ? Squatting. ? Climbing stairs.  You start a new physical activity or exercise program.  You wear shoes that do not fit well.  You do not have good leg strength.  You are overweight. What are the signs or symptoms? The main  symptom of this condition is knee pain. This may feel like a dull, aching pain underneath your patella, in the front of your knee. There may be a popping or cracking sound when you move your knee. Pain may get worse with:  Exercise.  Climbing stairs.  Running.  Jumping.  Squatting.  Kneeling.  Sitting for a long time.  Moving or pushing on your patella. How is this diagnosed? This condition may be diagnosed based on:  Your symptoms and medical history. You may be asked about your recent physical activities and which ones cause knee pain.  A physical exam. This may include: ? Moving your patella back and forth. ? Checking your range of knee motion. ? Having you squat or jump to see if you have pain. ? Checking the strength of your leg muscles.  Imaging tests to confirm the diagnosis. These may include an MRI of your knee. How is this treated? This condition may be treated at home with rest, ice, compression, and elevation (RICE).  Other treatments may include:  Nonsteroidal anti-inflammatory drugs (NSAIDs).  Physical therapy to stretch and strengthen your leg muscles.  Shoe inserts (orthotics) to take stress off your knee.  A knee brace or knee support.  Adhesive tapes to the skin.  Surgery to remove damaged cartilage or move the patella to a better position. This is rare. Follow these instructions at home: If you have a shoe or brace:  Wear the shoe or brace as told by your health care provider. Remove it only as told by your health care provider.  Loosen the shoe or brace if your toes tingle, become numb, or turn cold and blue.  Keep the shoe or brace clean.  If the shoe or brace is not waterproof: ? Do not let it get wet. ? Cover it with a watertight covering when you take a bath or a shower. Managing pain, stiffness, and swelling  If directed, put ice on the painful area. ? If you have a removable shoe or brace, remove it as told by your health care  provider. ? Put ice in a plastic bag. ? Place a towel between your skin and the bag. ? Leave the ice on for 20 minutes, 2-3 times a day.  Move your toes often to avoid stiffness and to lessen swelling.  Rest your knee: ? Avoid activities that cause knee pain. ? When sitting or lying down, raise (elevate) the injured area above the level of your heart, whenever possible. General instructions  Take over-the-counter and prescription medicines only as told by your health care provider.  Use splints, braces, knee supports, or walking aids as directed by your health care provider.  Perform stretching and strengthening exercises as told by your health care provider or physical therapist.  Do not use any products that contain nicotine or tobacco, such as cigarettes and e-cigarettes. These can delay healing. If you need help quitting, ask your health care provider.  Return to your normal activities as told by your health care provider. Ask your health care provider what activities are safe for you.  Keep all follow-up visits as told by your health care provider. This is important. Contact a health care provider if:  Your symptoms get worse.  You are not improving with home care. Summary  Patellofemoral pain syndrome is a condition in which the tissue (cartilage) on the underside of the kneecap (patella) softens or breaks down.  This condition causes swelling in the joint linings and bone surfaces in the knee. This leads to pain in the front of the knee.  This condition may be treated at home with rest, ice, compression, and elevation (RICE).  Use splints, braces, knee supports, or walking aids as directed by your health care provider. This information is not intended to replace advice given to you by your health care provider. Make sure you discuss any questions you have with your health care provider. Document Revised: 03/19/2017 Document Reviewed: 03/19/2017 Elsevier Patient Education   2020 [employer].

## 2020-01-28 NOTE — Progress Notes (Signed)
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  Otherwise normal-appearing

## 2020-01-30 ENCOUNTER — Other Ambulatory Visit: Payer: Self-pay

## 2020-01-30 ENCOUNTER — Ambulatory Visit (INDEPENDENT_AMBULATORY_CARE_PROVIDER_SITE_OTHER)
Admission: RE | Admit: 2020-01-30 | Discharge: 2020-01-30 | Disposition: A | Discharge status: Home / Self Care | Payer: 59 | Source: Ambulatory Visit | Attending: Family Medicine | Admitting: Family Medicine

## 2020-01-30 ENCOUNTER — Telehealth: Payer: Self-pay

## 2020-01-30 ENCOUNTER — Telehealth: Payer: Self-pay | Admitting: *Deleted

## 2020-01-30 DIAGNOSIS — F17210 Nicotine dependence, cigarettes, uncomplicated: Secondary | ICD-10-CM | POA: Diagnosis not present

## 2020-01-30 MED ORDER — VARENICLINE TARTRATE 1 MG PO TABS
1.0000 mg | ORAL_TABLET | Freq: Two times a day (BID) | ORAL | 5 refills | Status: DC
Start: 2020-01-30 — End: 2020-04-21

## 2020-01-30 NOTE — Telephone Encounter (Signed)
Ok with me. Please place any necessary orders. 

## 2020-01-30 NOTE — Telephone Encounter (Signed)
.   LAST APPOINTMENT DATE: 01/23/2020   NEXT APPOINTMENT DATE:@Visit  date not found  MEDICATION:varenicline (CHANTIX) 1 MG tablet  PHARMACY:[facility] [address] [place], [place] - [address] [place]  **Let patient know to contact pharmacy at the end of the day to make sure medication is ready. **  ** Please notify patient to allow 48-72 hours to process**  **Encourage patient to contact the pharmacy for refills or they can request refills through [facility]**  CLINICAL FILLS OUT ALL BELOW:   LAST REFILL:  QTY:  REFILL DATE:    OTHER COMMENTS:    Okay for refill?  Please advise

## 2020-01-30 NOTE — Telephone Encounter (Signed)
Rx sent 

## 2020-01-30 NOTE — Telephone Encounter (Signed)
Prescription done

## 2020-01-30 NOTE — Telephone Encounter (Signed)
[facility] called to informed Rx Chantix starting pack is on back order  Staring pack will be dispense genetic Rx Varenicline   AST:MHDQ one 0.5 mg tablet by mouth once daily for 3 days, then increase to one 0.5 mg tablet twice daily for 4 days  Continuing pack Rx Chantix 1 mg tablet twice daily. #90 with 2 refills  Pt will have a lower co-pay

## 2020-02-02 ENCOUNTER — Encounter: Payer: Self-pay | Admitting: Family Medicine

## 2020-02-02 ENCOUNTER — Telehealth: Payer: Self-pay

## 2020-02-02 NOTE — Telephone Encounter (Signed)
Patient aware see results note

## 2020-02-02 NOTE — Progress Notes (Signed)
Please inform patient of the following:  Nothing acute on her chest xray but does have some inflammation related to smoking. If she is not having any symptoms we do not need to do any further testing.  If having symptoms then we can refer to pulmonology for lung function testing.  Bonnie Parsons. [name], MD 02/02/2020 2:20 PM

## 2020-02-02 NOTE — Telephone Encounter (Signed)
Dr. [name] commented on pt x-ray on 12/10 at 11:35am.

## 2020-02-02 NOTE — Telephone Encounter (Signed)
Patient is calling back regarding lab results  

## 2020-03-09 ENCOUNTER — Other Ambulatory Visit: Payer: Self-pay | Admitting: Family Medicine

## 2020-03-09 DIAGNOSIS — Z1231 Encounter for screening mammogram for malignant neoplasm of breast: Secondary | ICD-10-CM

## 2020-03-17 ENCOUNTER — Telehealth: Payer: Self-pay

## 2020-03-17 NOTE — Telephone Encounter (Signed)
.  Please look into the following billing question below.   Patient has already called billing and was told: Insurance is not covering appt on 11/23 CPE due to it being improperly coded and billing is unable to change it due to it being our office that put in the code so our office has to change the codes in order for her insurance to covid the appt    Patient Name: Bonnie Parsons, Bonnie Parsons URK:[id number] DOB:[birth year] Date of Service:01/13/2020 Amount:   Patient was advised that this process can take up to a week or more. Patient also was advised that they may receive additional bills while this is being looked into and they are to hold onto all statements until resolved.

## 2020-03-19 NOTE — Telephone Encounter (Signed)
This was in regard to spouse bill.  I have created a phone note under patient.  I have spoken with spouse and sent an email to billing and have copied coding.

## 2020-03-25 ENCOUNTER — Other Ambulatory Visit: Payer: Self-pay | Admitting: *Deleted

## 2020-03-25 DIAGNOSIS — Z3044 Encounter for surveillance of vaginal ring hormonal contraceptive device: Secondary | ICD-10-CM

## 2020-03-25 MED ORDER — NUVARING 0.12-0.015 MG/24HR VA RING: VAGINAL_RING | VAGINAL | 3 refills | Status: DC

## 2020-03-26 ENCOUNTER — Ambulatory Visit
Admission: RE | Admit: 2020-03-26 | Discharge: 2020-03-26 | Disposition: A | Discharge status: Home / Self Care | Payer: 59 | Source: Ambulatory Visit

## 2020-03-26 ENCOUNTER — Other Ambulatory Visit: Payer: Self-pay | Admitting: Family Medicine

## 2020-03-26 ENCOUNTER — Other Ambulatory Visit: Payer: Self-pay

## 2020-03-26 DIAGNOSIS — Z3044 Encounter for surveillance of vaginal ring hormonal contraceptive device: Secondary | ICD-10-CM

## 2020-03-26 DIAGNOSIS — Z1231 Encounter for screening mammogram for malignant neoplasm of breast: Secondary | ICD-10-CM

## 2020-04-08 ENCOUNTER — Other Ambulatory Visit: Payer: 59

## 2020-04-08 ENCOUNTER — Telehealth: Payer: Self-pay

## 2020-04-08 NOTE — Telephone Encounter (Signed)
Patient is calling in stating that she is wanting a referral to a pulmonologist and some who can check on her thyroid.

## 2020-04-08 NOTE — Telephone Encounter (Signed)
Please advise 

## 2020-04-08 NOTE — Telephone Encounter (Signed)
Ok with referral to pulm.  Her thyroid levels were normal when we checked in December. Was there something specific she was worried about?  Katina Degree. [name], MD 04/08/2020 11:30 AM

## 2020-04-09 ENCOUNTER — Other Ambulatory Visit (INDEPENDENT_AMBULATORY_CARE_PROVIDER_SITE_OTHER): Payer: 59

## 2020-04-09 ENCOUNTER — Telehealth: Payer: Self-pay | Admitting: *Deleted

## 2020-04-09 DIAGNOSIS — Z1211 Encounter for screening for malignant neoplasm of colon: Secondary | ICD-10-CM | POA: Diagnosis not present

## 2020-04-09 LAB — FECAL OCCULT BLOOD, IMMUNOCHEMICAL: Fecal Occult Bld: NEGATIVE

## 2020-04-09 NOTE — Telephone Encounter (Signed)
[facility] requesting changes on Rx Nuvaring  To Rx Etonogestrel/ETH ES VAG RING  Sig place 1 ring vaginally every 28 days #3 Ref 3  Please advise

## 2020-04-09 NOTE — Progress Notes (Signed)
Please inform patient of the following:  FOBT is normal. She has no signs of colon cancer.  Bonnie Parsons. [name], MD 04/09/2020 9:34 AM

## 2020-04-09 NOTE — Telephone Encounter (Signed)
Please check with patient to make sure she is aware. Ok with sending in alternative.  Bonnie Parsons. [name], MD 04/09/2020 9:26 AM

## 2020-04-12 ENCOUNTER — Other Ambulatory Visit: Payer: Self-pay | Admitting: *Deleted

## 2020-04-12 MED ORDER — ETONOGESTREL-ETHINYL ESTRADIOL 0.12-0.015 MG/24HR VA RING: VAGINAL_RING | VAGINAL | 12 refills | Status: DC

## 2020-04-12 NOTE — Telephone Encounter (Signed)
Pt aware of changes  

## 2020-04-13 ENCOUNTER — Other Ambulatory Visit: Payer: Self-pay | Admitting: *Deleted

## 2020-04-13 DIAGNOSIS — F17210 Nicotine dependence, cigarettes, uncomplicated: Secondary | ICD-10-CM

## 2020-04-13 NOTE — Telephone Encounter (Signed)
Referral placed.

## 2020-04-21 ENCOUNTER — Other Ambulatory Visit (HOSPITAL_COMMUNITY)
Admission: RE | Admit: 2020-04-21 | Discharge: 2020-04-21 | Disposition: A | Discharge status: Home / Self Care | Payer: 59 | Source: Ambulatory Visit | Attending: Obstetrics and Gynecology | Admitting: Obstetrics and Gynecology

## 2020-04-21 ENCOUNTER — Other Ambulatory Visit: Payer: Self-pay

## 2020-04-21 ENCOUNTER — Encounter: Payer: Self-pay | Admitting: Obstetrics and Gynecology

## 2020-04-21 ENCOUNTER — Ambulatory Visit (INDEPENDENT_AMBULATORY_CARE_PROVIDER_SITE_OTHER): Payer: 59 | Admitting: Obstetrics and Gynecology

## 2020-04-21 VITALS — BP 121/68 | HR 67 | Wt 157.9 lb

## 2020-04-21 DIAGNOSIS — Z124 Encounter for screening for malignant neoplasm of cervix: Secondary | ICD-10-CM

## 2020-04-21 DIAGNOSIS — Z01419 Encounter for gynecological examination (general) (routine) without abnormal findings: Secondary | ICD-10-CM

## 2020-04-21 DIAGNOSIS — Z113 Encounter for screening for infections with a predominantly sexual mode of transmission: Secondary | ICD-10-CM | POA: Insufficient documentation

## 2020-04-21 DIAGNOSIS — Z3009 Encounter for other general counseling and advice on contraception: Secondary | ICD-10-CM

## 2020-04-21 MED ORDER — ETONOGESTREL-ETHINYL ESTRADIOL 0.12-0.015 MG/24HR VA RING: VAGINAL_RING | VAGINAL | 4 refills | Status: DC

## 2020-04-21 NOTE — Progress Notes (Signed)
GYNECOLOGY ANNUAL PREVENTATIVE CARE ENCOUNTER NOTE  Subjective:   Bonnie Parsons is a 40 y.o. G26P1001 female here for a annual gynecologic exam. Current complaints: having issues with Nuvaring being covered by insurance and getting expired ones [facility].    Denies abnormal vaginal bleeding, discharge, pelvic pain, problems with intercourse or other gynecologic concerns. Accepts STI screen.  She is no longer smoking, for several months.   Gynecologic History Patient's last menstrual period was 03/31/2020 (exact date). Contraception: NuvaRing vaginal inserts Last Pap: 10/2017. Results: normal Last mammogram: 03/2020. Results: Birads 1 DEXA: has never had  Obstetric History OB History  Gravida Para Term Preterm AB Living  1 1 1  0 0 1  SAB IAB Ectopic Multiple Live Births  0 0 0 0 1    # Outcome Date GA Lbr Len/2nd Weight Sex Delivery Anes PTL Lv  1 Term             Past Medical History:  Diagnosis Date  . Thyroid disease     Past Surgical History:  Procedure Laterality Date  . CESAREAN SECTION      Current Outpatient Medications on File Prior to Visit  Medication Sig Dispense Refill  . triamcinolone (KENALOG) 0.1 % Apply topically.    . varenicline (CHANTIX STARTING MONTH PAK) 0.5 MG X 11 & 1 MG X 42 tablet Take one 0.5 mg tablet by mouth once daily for 3 days, then increase to one 0.5 mg tablet twice daily for 4 days, then increase to one 1 mg tablet twice daily. (Patient not taking: Reported on 04/21/2020) 53 tablet 0   No current facility-administered medications on file prior to visit.    Allergies  Allergen Reactions  . No Known Allergies     Social History   Socioeconomic History  . Marital status: Married    Spouse name: Not on file  . Number of children: Not on file  . Years of education: Not on file  . Highest education level: Not on file  Occupational History  . Not on file  Tobacco Use  . Smoking status: Former Smoker     Packs/day: 0.25    Years: 20.00    Pack years: 5.00    Types: Cigarettes    Quit date: 01/2020    Years since quitting: 0.2  . Smokeless tobacco: Never Used  Vaping Use  . Vaping Use: Never used  Substance and Sexual Activity  . Alcohol use: Yes  . Drug use: No  . Sexual activity: Yes    Partners: Male    Birth control/protection: Inserts  Other Topics Concern  . Not on file  Social History Narrative  . Not on file   Social Determinants of Health   Financial Resource Strain: Not on file  Food Insecurity: Not on file  Transportation Needs: Not on file  Physical Activity: Not on file  Stress: Not on file  Social Connections: Not on file  Intimate Partner Violence: Not on file    Family History  Problem Relation Age of Onset  . Cancer Father        Lung  . Early death Father   . Hyperlipidemia Paternal Grandmother   . Hypertension Paternal Grandmother   . Thyroid disease Mother   . Asthma Mother   . Breast cancer Neg Hx    The following portions of the patient's history were reviewed and updated as appropriate: allergies, current medications, past family history, past medical history, past social history,  past surgical history and problem list.  Review of Systems Pertinent items are noted in HPI.   Objective:  BP 121/68   Pulse 67   Wt 157 lb 14.4 oz (71.6 kg)   LMP 03/31/2020 (Exact Date)   BMI 27.10 kg/m  CONSTITUTIONAL: Well-developed, well-nourished female in no acute distress.  HENT:  Normocephalic, atraumatic, External right and left ear normal. Oropharynx is clear and moist EYES: Conjunctivae and EOM are normal. Pupils are equal, round, and reactive to light. No scleral icterus.  NECK: Normal range of motion, supple, no masses.  Normal thyroid.  SKIN: Skin is warm and dry. No rash noted. Not diaphoretic. No erythema. No pallor. NEUROLOGIC: Alert and oriented to person, place, and time. Normal reflexes, muscle tone coordination. No cranial nerve deficit  noted. PSYCHIATRIC: Normal mood and affect. Normal behavior. Normal judgment and thought content. CARDIOVASCULAR: Normal heart rate noted RESPIRATORY: Effort normal, no problems with respiration noted. BREASTS: deferred per patient ABDOMEN: Soft, no distention noted.  No tenderness, rebound or guarding. Well healed pfannenstiel PELVIC: Normal appearing external genitalia; normal appearing vaginal mucosa and cervix.  No abnormal discharge noted.  Pap smear obtained. Pelvic cultures obtained. Nuvaring in place. Normal uterine size, no other palpable masses, no uterine or adnexal tenderness. MUSCULOSKELETAL: Normal range of motion. No tenderness.  No cyanosis, clubbing, or edema.  2+ distal pulses.  Exam done with chaperone present.   Assessment and Plan:   1. Well woman exam Healthy female exam Congratulated patient on quitting smoking  2. Encounter for counseling regarding contraception Cont nuvaring as she is very happy with it Sent to different pharmacy to see if insurance will cover at [facility]  3. Routine screening for STI (sexually transmitted infection) - Hepatitis B surface antigen - Hepatitis C antibody - HIV Antibody (routine testing w rflx) - RPR - Cervicovaginal ancillary only( [employer])  4. Cervical cancer screening - Cytology - PAP( [facility])  Will follow up results of pap smear/STI screen and manage accordingly. Encouraged improvement in diet and exercise.  COVID vaccine UTD Accepts STI screen. Mammogram UTD Referral for colonoscopy n/a Flu vaccine n/a DEXA not due based on age  Routine preventative health maintenance measures emphasized. Please refer to After Visit Summary for other counseling recommendations.  \  K. Therese Sarah, MD, [facility] Attending Center for [facility]

## 2020-04-22 LAB — CERVICOVAGINAL ANCILLARY ONLY
Chlamydia: NEGATIVE
Comment: NEGATIVE
Comment: NEGATIVE
Comment: NORMAL
Neisseria Gonorrhea: NEGATIVE
Trichomonas: NEGATIVE

## 2020-04-22 LAB — HIV ANTIBODY (ROUTINE TESTING W REFLEX): HIV Screen 4th Generation wRfx: NONREACTIVE

## 2020-04-22 LAB — RPR: RPR Ser Ql: NONREACTIVE

## 2020-04-22 LAB — HEPATITIS C ANTIBODY: Hep C Virus Ab: <0.1 s/co ratio (ref 0.0–0.9)

## 2020-04-22 LAB — HEPATITIS B SURFACE ANTIGEN: Hepatitis B Surface Ag: NEGATIVE

## 2020-04-26 LAB — CYTOLOGY - PAP
Comment: NEGATIVE
Diagnosis: UNDETERMINED — AB
High risk HPV: NEGATIVE

## 2020-05-11 ENCOUNTER — Ambulatory Visit (INDEPENDENT_AMBULATORY_CARE_PROVIDER_SITE_OTHER): Payer: 59 | Admitting: Pulmonary Disease

## 2020-05-11 ENCOUNTER — Other Ambulatory Visit: Payer: Self-pay

## 2020-05-11 ENCOUNTER — Encounter: Payer: Self-pay | Admitting: Pulmonary Disease

## 2020-05-11 VITALS — BP 114/74 | HR 71 | Temp 98.0°F | Ht 66.0 in | Wt 157.6 lb

## 2020-05-11 DIAGNOSIS — R9389 Abnormal findings on diagnostic imaging of other specified body structures: Secondary | ICD-10-CM | POA: Diagnosis not present

## 2020-05-11 NOTE — Patient Instructions (Signed)
We will schedule you for CT scan chest without contrast  Call with significant concerns  Follow-up as needed

## 2020-05-11 NOTE — Progress Notes (Signed)
Elise Knobloch    [id number]    11/12/1980  Primary Care Physician:Parker, Katina Degree, MD  Referring Physician: Ardith Dark, MD [address] [place],  [place] [address]  Chief complaint:  Patient with concerns about an abnormal chest x-ray  HPI:  Chest x-ray was performed December 2021 for some shortness of breath Showed chronic prominence of interstitial markings  Patient concerned about interstitial lung disease  Reformed smoker Family history-dad had lung cancer-smoked  Exercises regularly Able to run up to 5 miles  No cough, no weight loss, no shortness of breath with mild to significant exertion -Does get winded at the end of our rounds  No significant ongoing health problems  She does have some upper back midline discomfort  Outpatient Encounter Medications as of 05/11/2020  Medication Sig  . etonogestrel-ethinyl estradiol (NUVARING) 0.12-0.015 MG/24HR vaginal ring Insert vaginally and leave in place for 3 consecutive weeks, then remove for 1 week.  . triamcinolone (KENALOG) 0.1 % Apply topically.  . [DISCONTINUED] varenicline (CHANTIX STARTING MONTH PAK) 0.5 MG X 11 & 1 MG X 42 tablet Take one 0.5 mg tablet by mouth once daily for 3 days, then increase to one 0.5 mg tablet twice daily for 4 days, then increase to one 1 mg tablet twice daily. (Patient not taking: Reported on 04/21/2020)   No facility-administered encounter medications on file as of 05/11/2020.    Allergies as of 05/11/2020 - Review Complete 05/11/2020  Allergen Reaction Noted  . No known allergies      Past Medical History:  Diagnosis Date  . Thyroid disease     Past Surgical History:  Procedure Laterality Date  . CESAREAN SECTION      Family History  Problem Relation Age of Onset  . Cancer Father        Lung  . Early death Father   . Hyperlipidemia Paternal Grandmother   . Hypertension Paternal Grandmother   . Thyroid disease Mother   . Asthma Mother   . Breast cancer  Neg Hx     Social History   Socioeconomic History  . Marital status: Married    Spouse name: Not on file  . Number of children: Not on file  . Years of education: Not on file  . Highest education level: Not on file  Occupational History  . Not on file  Tobacco Use  . Smoking status: Former Smoker    Packs/day: 0.25    Years: 20.00    Pack years: 5.00    Types: Cigarettes    Quit date: 01/2020    Years since quitting: 0.3  . Smokeless tobacco: Never Used  Vaping Use  . Vaping Use: Never used  Substance and Sexual Activity  . Alcohol use: Yes  . Drug use: No  . Sexual activity: Yes    Partners: Male    Birth control/protection: Inserts  Other Topics Concern  . Not on file  Social History Narrative  . Not on file   Social Determinants of Health   Financial Resource Strain: Not on file  Food Insecurity: No Food Insecurity  . Worried About Programme researcher, broadcasting/film/video in the Last Year: Never true  . Ran Out of Food in the Last Year: Never true  Transportation Needs: No Transportation Needs  . Lack of Transportation (Medical): No  . Lack of Transportation (Non-Medical): No  Physical Activity: Not on file  Stress: Not on file  Social Connections: Not on  file  Intimate Partner Violence: Not on file    Review of Systems  Constitutional: Negative for appetite change and fatigue.  Respiratory: Negative for cough and shortness of breath.     Vitals:   05/11/20 1127  BP: 114/74  Pulse: 71  Temp: 98 F (36.7 C)  SpO2: 98%     Physical Exam Vitals reviewed.  Constitutional:      Appearance: Normal appearance.  HENT:     Head: Normocephalic.     Mouth/Throat:     Mouth: Mucous membranes are moist.  Eyes:     General: No scleral icterus.       Right eye: No discharge.        Left eye: No discharge.     Pupils: Pupils are equal, round, and reactive to light.  Cardiovascular:     Heart sounds: No murmur heard. No friction rub.  Pulmonary:     Effort: No  respiratory distress.     Breath sounds: No stridor. No wheezing or rhonchi.  Musculoskeletal:     Cervical back: No rigidity or tenderness.  Neurological:     Mental Status: She is alert.  Psychiatric:        Mood and Affect: Mood normal.    Data Reviewed: Chest x-ray reveals prominent interstitial markings  Assessment:  Abnormal chest x-ray  Patient with concern about interstitial lung disease Concern with family history of lung cancer  Has no specific pulmonary limitations at present  Plan/Recommendations: CT scan of the chest without contrast to further evaluate lung structure  I did reassure her that it is unlikely that she has significant lung disease especially with her ability to exercise on a regular basis although this will not prove that she does not have early interstitial changes  I will follow-up with her as needed  Encouraged to call with any significant concerns   Virl Diamond MD [facility] 05/11/2020, 11:52 AM  CC: Ardith Dark, MD

## 2020-05-11 NOTE — Addendum Note (Signed)
Addended bySandra Cockayne on: 05/11/2020 12:01 PM   Modules accepted: Orders

## 2020-05-15 ENCOUNTER — Other Ambulatory Visit: Payer: Self-pay

## 2020-05-15 ENCOUNTER — Ambulatory Visit
Admission: RE | Admit: 2020-05-15 | Discharge: 2020-05-15 | Disposition: A | Discharge status: Home / Self Care | Payer: 59 | Source: Ambulatory Visit | Attending: Pulmonary Disease | Admitting: Pulmonary Disease

## 2020-05-15 DIAGNOSIS — R9389 Abnormal findings on diagnostic imaging of other specified body structures: Secondary | ICD-10-CM

## 2020-05-19 NOTE — Telephone Encounter (Signed)
Please advise on patient mychart message  Hi Doctor Wynona Neat! I was wondering if you had the chance to review the CT finding and wanted your opinion on how to read it. It lists mild emphysema, how mild or advanced is it and are there any treatment options of additional tests you would recommend? I tried to refrain from reading too much about this online until I hear from you. Has it been changing at all since the December scan? Thanks so much! Bonnie Parsons

## 2020-05-20 ENCOUNTER — Telehealth: Payer: Self-pay | Admitting: Pulmonary Disease

## 2020-05-20 NOTE — Telephone Encounter (Signed)
Kindly schedule for PFT

## 2020-05-20 NOTE — Telephone Encounter (Signed)
Called and spoke with patient. I was able to get her scheduled for a PFT on 05/26/20 and covid test on 05/21/20. She is aware of the testing location.   Nothing further needed at time of call.

## 2020-05-21 ENCOUNTER — Other Ambulatory Visit (HOSPITAL_COMMUNITY)
Admission: RE | Admit: 2020-05-21 | Discharge: 2020-05-21 | Disposition: A | Discharge status: Home / Self Care | Payer: 59 | Source: Ambulatory Visit | Attending: Pulmonary Disease | Admitting: Pulmonary Disease

## 2020-05-21 DIAGNOSIS — Z20822 Contact with and (suspected) exposure to covid-19: Secondary | ICD-10-CM | POA: Insufficient documentation

## 2020-05-21 DIAGNOSIS — Z01812 Encounter for preprocedural laboratory examination: Secondary | ICD-10-CM | POA: Insufficient documentation

## 2020-05-22 LAB — SARS CORONAVIRUS 2 (TAT 6-24 HRS): SARS Coronavirus 2: NEGATIVE

## 2020-05-25 ENCOUNTER — Other Ambulatory Visit: Payer: Self-pay | Admitting: Pulmonary Disease

## 2020-05-25 DIAGNOSIS — R0609 Other forms of dyspnea: Secondary | ICD-10-CM

## 2020-05-25 DIAGNOSIS — R0600 Dyspnea, unspecified: Secondary | ICD-10-CM

## 2020-05-26 ENCOUNTER — Other Ambulatory Visit: Payer: Self-pay

## 2020-05-26 ENCOUNTER — Ambulatory Visit (INDEPENDENT_AMBULATORY_CARE_PROVIDER_SITE_OTHER): Payer: 59 | Admitting: Pulmonary Disease

## 2020-05-26 DIAGNOSIS — R0600 Dyspnea, unspecified: Secondary | ICD-10-CM | POA: Diagnosis not present

## 2020-05-26 DIAGNOSIS — R0609 Other forms of dyspnea: Secondary | ICD-10-CM

## 2020-05-26 LAB — PULMONARY FUNCTION TEST
DL/VA % pred: 129 %
DL/VA: 5.69 ml/min/mmHg/L
DLCO cor % pred: 110 %
DLCO cor: 24.75 ml/min/mmHg
DLCO unc % pred: 110 %
DLCO unc: 24.75 ml/min/mmHg
FEF 25-75 Post: 3.15 L/sec
FEF 25-75 Pre: 2.95 L/sec
FEF2575-%Change-Post: 6 %
FEF2575-%Pred-Post: 101 %
FEF2575-%Pred-Pre: 94 %
FEV1-%Change-Post: 0 %
FEV1-%Pred-Post: 90 %
FEV1-%Pred-Pre: 89 %
FEV1-Post: 2.77 L
FEV1-Pre: 2.75 L
FEV1FVC-%Change-Post: 3 %
FEV1FVC-%Pred-Pre: 102 %
FEV6-%Change-Post: -4 %
FEV6-%Pred-Post: 84 %
FEV6-%Pred-Pre: 88 %
FEV6-Post: 3.15 L
FEV6-Pre: 3.29 L
FEV6FVC-%Change-Post: 0 %
FEV6FVC-%Pred-Post: 101 %
FEV6FVC-%Pred-Pre: 102 %
FVC-%Change-Post: -3 %
FVC-%Pred-Post: 84 %
FVC-%Pred-Pre: 86 %
FVC-Post: 3.19 L
FVC-Pre: 3.29 L
Post FEV1/FVC ratio: 87 %
Post FEV6/FVC ratio: 100 %
Pre FEV1/FVC ratio: 84 %
Pre FEV6/FVC Ratio: 100 %
RV % pred: 93 %
RV: 1.57 L
TLC % pred: 89 %
TLC: 4.64 L

## 2020-05-26 NOTE — Progress Notes (Signed)
PFT done today. 

## 2020-06-09 ENCOUNTER — Telehealth: Payer: Self-pay | Admitting: Pulmonary Disease

## 2020-06-09 NOTE — Telephone Encounter (Signed)
Patient calling for results on PFT test.   AO please advise  Thanks

## 2020-06-10 NOTE — Telephone Encounter (Signed)
Called and spoke with the pt and notified of response per Dr Val Eagle  She verbalized understanding  Wanted copy mailed  Verified address and mailed to her

## 2020-06-10 NOTE — Telephone Encounter (Signed)
Pft reviewed  Normal PFT No pulmonary dysfunction on PFT

## 2020-06-10 NOTE — Telephone Encounter (Signed)
LMTCB

## 2021-01-28 ENCOUNTER — Encounter: Payer: Self-pay | Admitting: Family Medicine

## 2021-02-25 ENCOUNTER — Other Ambulatory Visit: Payer: Self-pay | Admitting: Family Medicine

## 2021-05-20 ENCOUNTER — Other Ambulatory Visit: Payer: Self-pay | Admitting: Family Medicine

## 2021-05-20 NOTE — Telephone Encounter (Signed)
Needs appointment

## 2021-05-20 NOTE — Telephone Encounter (Signed)
Last visit: 01/23/20 ? ?Next visit: none scheduled ? ?Last filled: 02/25/21 ? ?Quantity: 3 rings ? ? ?

## 2021-06-28 IMAGING — CT CT CHEST W/O CM
1 series · 16 of 34 positions shown, 20 images · non-contrast
Comparison: 01/30/2020 chest radiograph.

CLINICAL DATA: Dyspnea, former smoker.

EXAM:
CT CHEST WITHOUT CONTRAST
TECHNIQUE: Multidetector CT imaging of the chest was performed following the
standard protocol without IV contrast.

[Series 2: chest w/(date) · axial · 0.69mm/px · z∈[-256,+6]mm · 16 of 148 slices shown, 20 images]
[im 11/148  mediastinal]
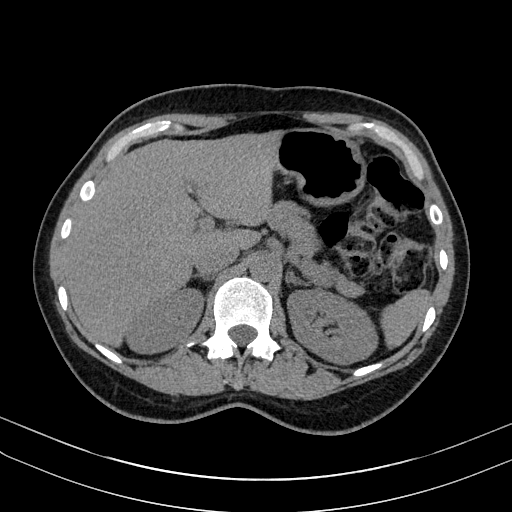
[im 11/148  lung]
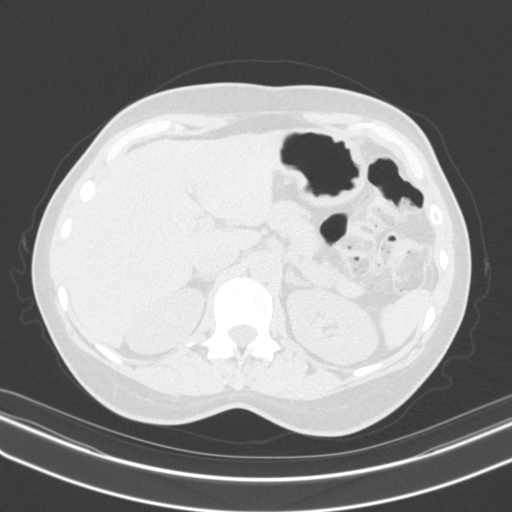
[im 22/148  lung]
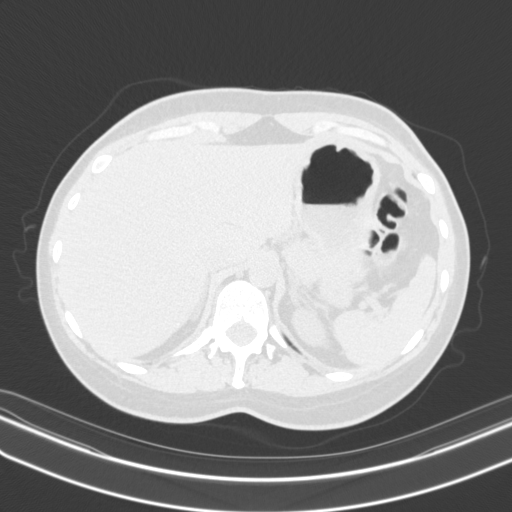
[im 30/148  lung]
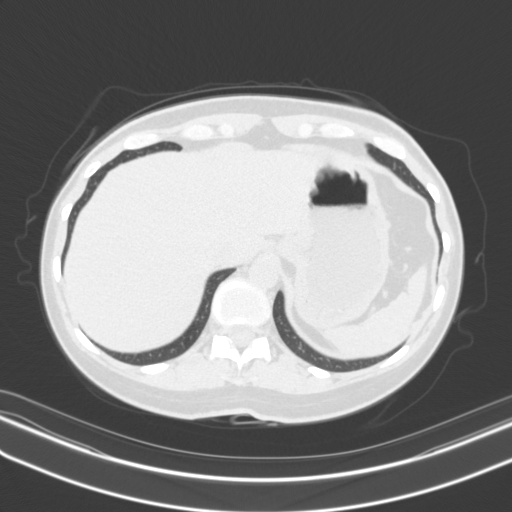
[im 39/148  lung]
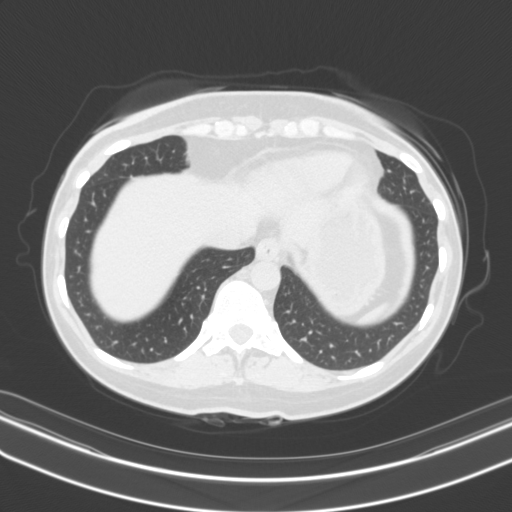
[im 50/148  mediastinal]
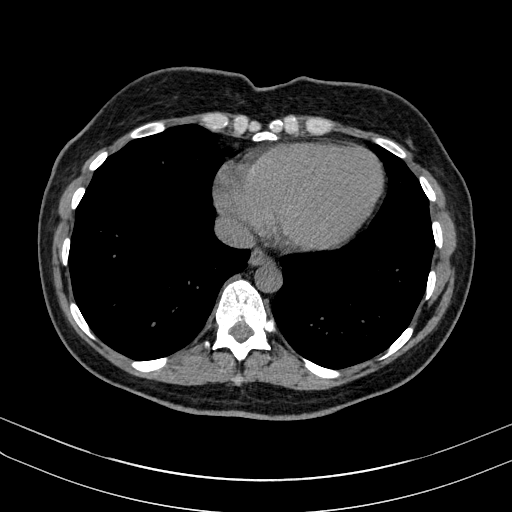
[im 50/148  lung]
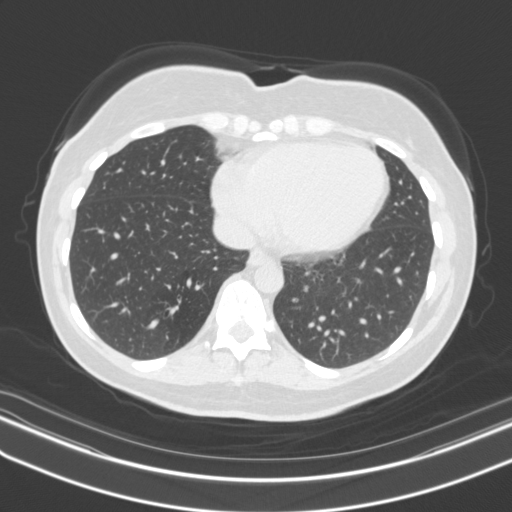
[im 59/148  lung]
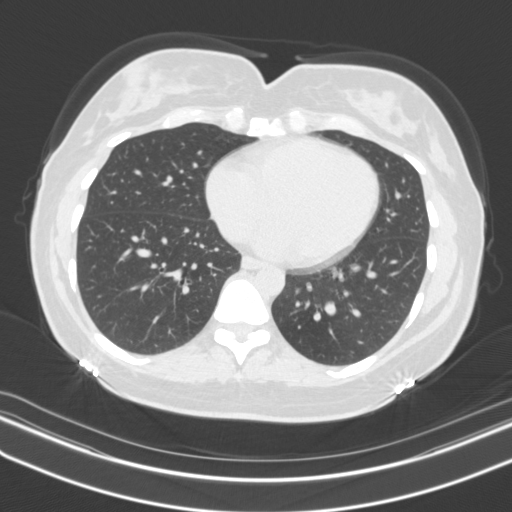
[im 66/148  lung]
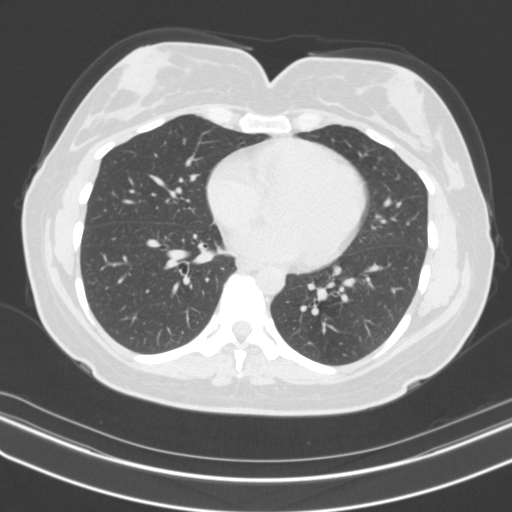
[im 71/148  lung]
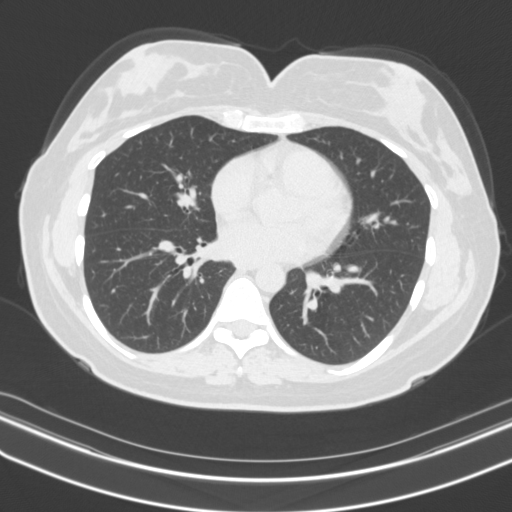
[im 79/148  mediastinal]
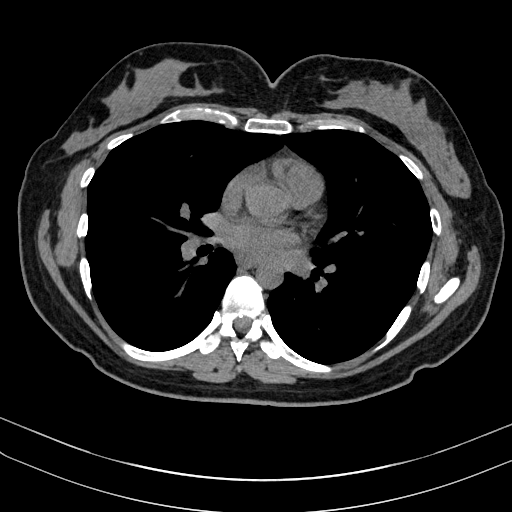
[im 79/148  lung]
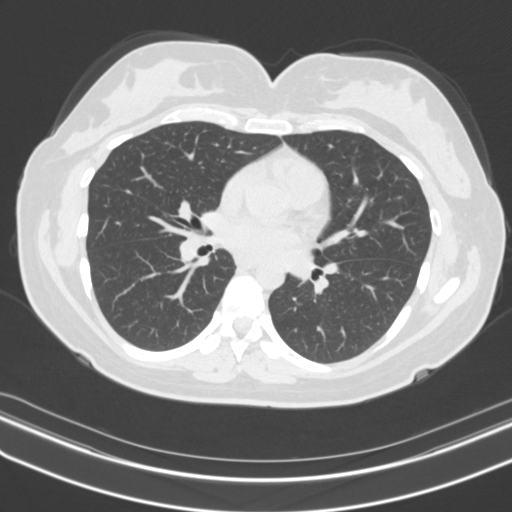
[im 88/148  lung]
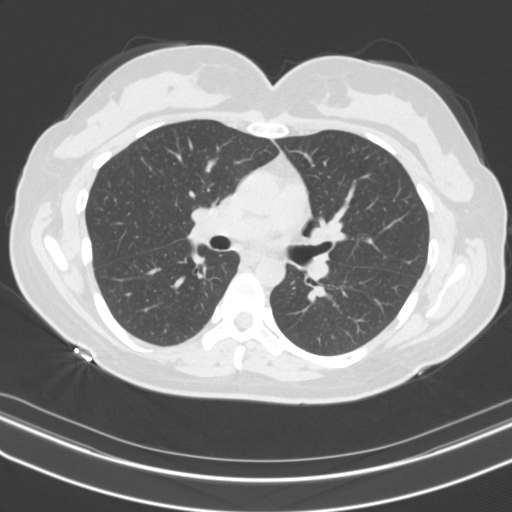
[im 93/148  lung]
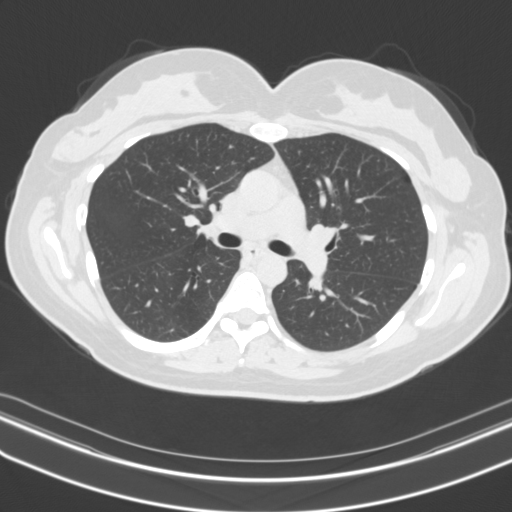
[im 104/148  lung]
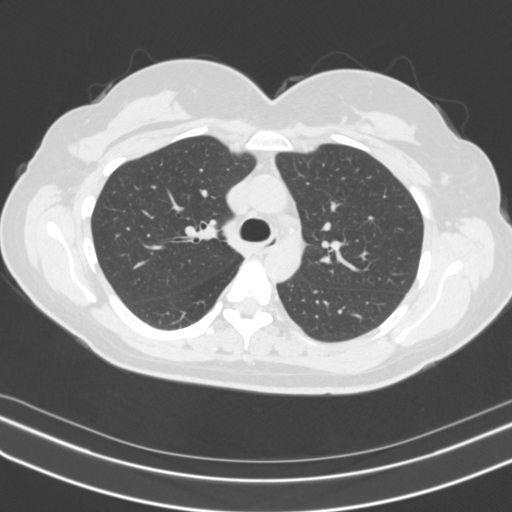
[im 115/148  mediastinal]
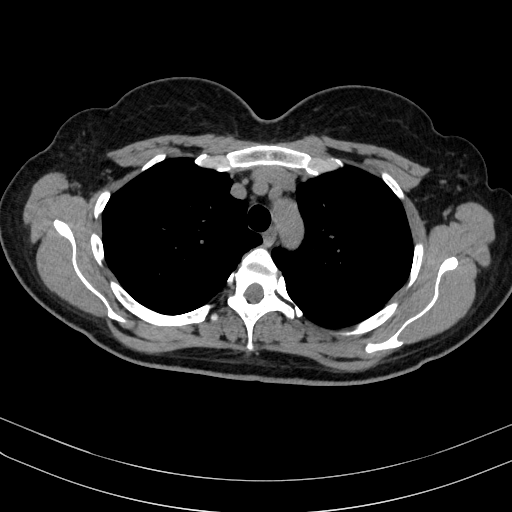
[im 115/148  lung]
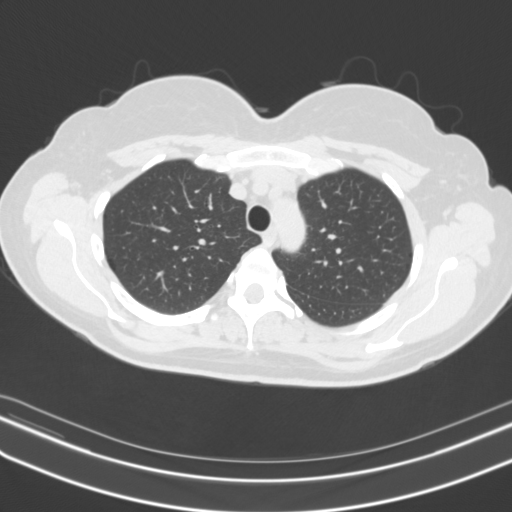
[im 120/148  lung]
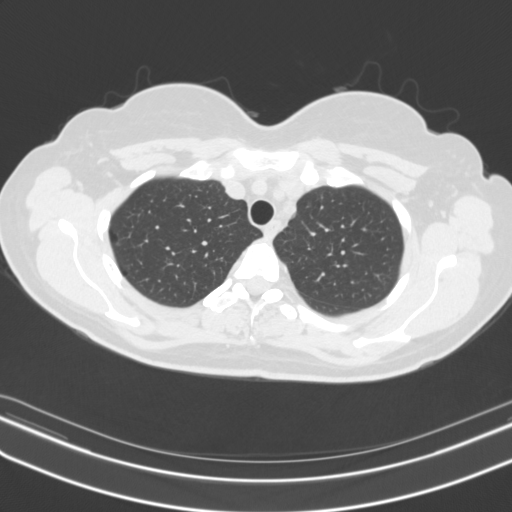
[im 131/148  lung]
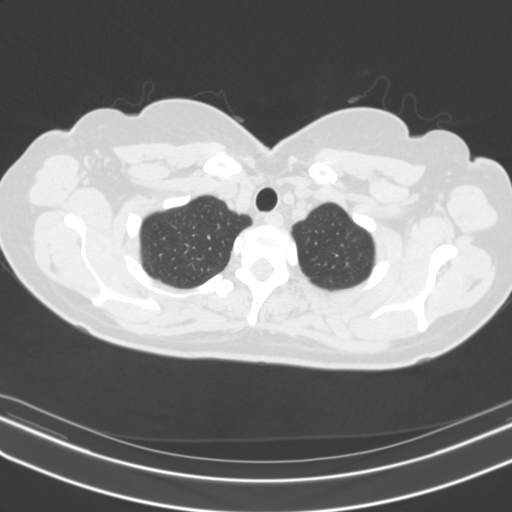
[im 142/148  lung]
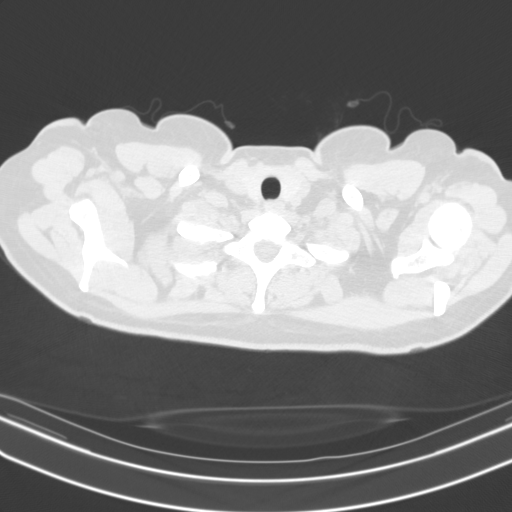

[16 of 34 positions shown; findings below may reference images not displayed]

FINDINGS: Cardiovascular: Normal heart size. No significant pericardial
effusion/thickening. Great vessels are normal in course and caliber.

Mediastinum/Nodes: No discrete thyroid nodules. Unremarkable
esophagus. No pathologically enlarged axillary, mediastinal or hilar
lymph nodes, noting limited sensitivity for the detection of hilar
adenopathy on this noncontrast study.

Lungs/Pleura: No pneumothorax. No pleural effusion. Mild paraseptal
emphysema. No acute consolidative airspace disease, lung masses or
significant pulmonary nodules.

Upper abdomen: No acute abnormality.

Musculoskeletal: No aggressive appearing focal osseous lesions. Mild
thoracic spondylosis.
IMPRESSION: 1. Mild paraseptal emphysema. Otherwise no active pulmonary disease.
2. Emphysema ([id number]).

## 2021-07-27 LAB — LAB REPORT - SCANNED: A1c: 33

## 2021-11-14 ENCOUNTER — Encounter: Payer: Self-pay | Admitting: *Deleted

## 2022-02-02 ENCOUNTER — Encounter: Payer: Self-pay | Admitting: *Deleted

## 2022-04-21 ENCOUNTER — Other Ambulatory Visit: Payer: Self-pay | Admitting: Family Medicine

## 2022-07-31 LAB — LAB REPORT - SCANNED: A1c: 34

## 2023-01-08 ENCOUNTER — Other Ambulatory Visit (HOSPITAL_COMMUNITY)
Admission: RE | Admit: 2023-01-08 | Discharge: 2023-01-08 | Disposition: A | Discharge status: Home / Self Care | Payer: 59 | Source: Ambulatory Visit | Attending: Family Medicine | Admitting: Family Medicine

## 2023-01-08 ENCOUNTER — Encounter: Payer: Self-pay | Admitting: Family Medicine

## 2023-01-08 ENCOUNTER — Ambulatory Visit (INDEPENDENT_AMBULATORY_CARE_PROVIDER_SITE_OTHER): Payer: 59 | Admitting: Family Medicine

## 2023-01-08 ENCOUNTER — Other Ambulatory Visit: Payer: Self-pay

## 2023-01-08 VITALS — BP 114/79 | HR 62 | Wt 152.2 lb

## 2023-01-08 DIAGNOSIS — Z1151 Encounter for screening for human papillomavirus (HPV): Secondary | ICD-10-CM | POA: Diagnosis not present

## 2023-01-08 DIAGNOSIS — Z01419 Encounter for gynecological examination (general) (routine) without abnormal findings: Secondary | ICD-10-CM | POA: Diagnosis present

## 2023-01-08 NOTE — Progress Notes (Signed)
   GYNECOLOGY ANNUAL PREVENTATIVE CARE ENCOUNTER NOTE  Subjective:   Bonnie Parsons is a 42 y.o. G63P1001 female here for a routine annual gynecologic exam.  Current complaints: none.     Menses are regular periods every 28 days  Was in [place] for 2 years, had screenings there  Denies abnormal vaginal bleeding, discharge, pelvic pain, problems with intercourse or other gynecologic concerns.    Gynecologic History Patient's last menstrual period was 12/19/2022 (exact date). Contraception: NuvaRing vaginal inserts-- not using Last Pap: Up to date. Results were: normal Last mammogram: UTD. Results were: normal  Health Maintenance Due  Topic Date Due   INFLUENZA VACCINE  09/21/2022   COVID-19 Vaccine (4 - 2023-24 season) 10/22/2022    The following portions of the patient's history were reviewed and updated as appropriate: allergies, current medications, past family history, past medical history, past social history, past surgical history and problem list.  Review of Systems Pertinent items are noted in HPI.   Objective:  BP 114/79   Pulse 62   Wt 152 lb 4 oz (69.1 kg)   LMP 12/19/2022 (Exact Date)   BMI 24.57 kg/m  CONSTITUTIONAL: Well-developed, well-nourished female in no acute distress.  HENT:  Normocephalic, atraumatic, External right and left ear normal. Oropharynx is clear and moist EYES:  No scleral icterus.  NECK: Normal range of motion, supple, no masses.  Normal thyroid.  SKIN: Skin is warm and dry. No rash noted. Not diaphoretic. No erythema. No pallor. NEUROLOGIC: Alert and oriented to person, place, and time. Normal reflexes, muscle tone coordination. No cranial nerve deficit noted. PSYCHIATRIC: Normal mood and affect. Normal behavior. Normal judgment and thought content. CARDIOVASCULAR: Normal heart rate noted, regular rhythm. 2+ distal pulses. RESPIRATORY: Effort and breath sounds normal, no problems with respiration noted. BREASTS: Symmetric in size. No  masses, skin changes, nipple drainage, or lymphadenopathy. ABDOMEN: Soft,  no distention noted.  No tenderness, rebound or guarding.  PELVIC: WNL. Pap collected MUSCULOSKELETAL: Normal range of motion.      Assessment and Plan:  1) Annual gynecologic examination: pap today  Routine preventative health maintenance measures emphasized. Reviewed perimenopausal symptoms and management.   2) Contraception counseling: comfortable with current plan. Likes nuvaring. .  1. Well woman exam with routine gynecological exam - Cytology - PAP   Please refer to After Visit Summary for other counseling recommendations.   No follow-ups on file.  Federico Flake, MD, MPH, ABFM Attending Physician Center for [facility]

## 2023-01-12 LAB — CYTOLOGY - PAP
Comment: NEGATIVE
Diagnosis: NEGATIVE
High risk HPV: NEGATIVE

## 2023-01-15 ENCOUNTER — Telehealth: Payer: Self-pay | Admitting: Lactation Services

## 2023-01-15 NOTE — Telephone Encounter (Signed)
-----   Message from Federico Flake sent at 01/15/2023 12:56 PM EST ----- NIL, HPV negative. Next pap in 5 years.

## 2023-01-15 NOTE — Telephone Encounter (Signed)
Called patient with results of Pap Smear and recommended follow up. Patient did not answer. LM for patient to check her My Chart message for results. Per chart review, patient has seen message from Dr. Alvester Morin.

## 2023-03-13 ENCOUNTER — Encounter: Payer: 59 | Admitting: Family Medicine

## 2023-03-15 ENCOUNTER — Ambulatory Visit (INDEPENDENT_AMBULATORY_CARE_PROVIDER_SITE_OTHER): Payer: 59 | Admitting: Family Medicine

## 2023-03-15 ENCOUNTER — Encounter: Payer: Self-pay | Admitting: Family Medicine

## 2023-03-15 ENCOUNTER — Ambulatory Visit: Payer: 59

## 2023-03-15 VITALS — BP 111/77 | HR 61 | Temp 98.2°F | Ht 66.0 in | Wt 156.0 lb

## 2023-03-15 DIAGNOSIS — Z1211 Encounter for screening for malignant neoplasm of colon: Secondary | ICD-10-CM

## 2023-03-15 DIAGNOSIS — R718 Other abnormality of red blood cells: Secondary | ICD-10-CM

## 2023-03-15 DIAGNOSIS — R739 Hyperglycemia, unspecified: Secondary | ICD-10-CM | POA: Diagnosis not present

## 2023-03-15 DIAGNOSIS — R7989 Other specified abnormal findings of blood chemistry: Secondary | ICD-10-CM | POA: Diagnosis not present

## 2023-03-15 DIAGNOSIS — W57XXXD Bitten or stung by nonvenomous insect and other nonvenomous arthropods, subsequent encounter: Secondary | ICD-10-CM | POA: Diagnosis not present

## 2023-03-15 DIAGNOSIS — E785 Hyperlipidemia, unspecified: Secondary | ICD-10-CM | POA: Diagnosis not present

## 2023-03-15 DIAGNOSIS — E041 Nontoxic single thyroid nodule: Secondary | ICD-10-CM

## 2023-03-15 DIAGNOSIS — Z0001 Encounter for general adult medical examination with abnormal findings: Secondary | ICD-10-CM

## 2023-03-15 DIAGNOSIS — R059 Cough, unspecified: Secondary | ICD-10-CM | POA: Diagnosis not present

## 2023-03-15 DIAGNOSIS — Z87891 Personal history of nicotine dependence: Secondary | ICD-10-CM

## 2023-03-15 LAB — COMPREHENSIVE METABOLIC PANEL WITH GFR
ALT: 12 U/L (ref 0–35)
AST: 16 U/L (ref 0–37)
Albumin: 4.4 g/dL (ref 3.5–5.2)
Alkaline Phosphatase: 44 U/L (ref 39–117)
BUN: 13 mg/dL (ref 6–23)
CO2: 27 meq/L (ref 19–32)
Calcium: 8.9 mg/dL (ref 8.4–10.5)
Chloride: 106 meq/L (ref 96–112)
Creatinine, Ser: 0.56 mg/dL (ref 0.40–1.20)
GFR: 110.62 mL/min (ref >60.00)
Glucose, Bld: 91 mg/dL (ref 70–99)
Potassium: 3.8 meq/L (ref 3.5–5.1)
Sodium: 140 meq/L (ref 135–145)
Total Bilirubin: 0.6 mg/dL (ref 0.2–1.2)
Total Protein: 6.8 g/dL (ref 6.0–8.3)

## 2023-03-15 LAB — TSH: TSH: 1.19 u[IU]/mL (ref 0.35–5.50)

## 2023-03-15 LAB — LIPID PANEL
Cholesterol: 164 mg/dL (ref 0–200)
HDL: 61.5 mg/dL (ref >39.00)
LDL Cholesterol: 84 mg/dL (ref 0–99)
NonHDL: 102.07
Total CHOL/HDL Ratio: 3
Triglycerides: 89 mg/dL (ref 0.0–149.0)
VLDL: 17.8 mg/dL (ref 0.0–40.0)

## 2023-03-15 LAB — CBC
HCT: 39.8 % (ref 36.0–46.0)
Hemoglobin: 13.1 g/dL (ref 12.0–15.0)
MCHC: 33 g/dL (ref 30.0–36.0)
MCV: 100.9 fL — ABNORMAL HIGH (ref 78.0–100.0)
Platelets: 196 10*3/uL (ref 150.0–400.0)
RBC: 3.95 Mil/uL (ref 3.87–5.11)
RDW: 12.2 % (ref 11.5–15.5)
WBC: 8 10*3/uL (ref 4.0–10.5)

## 2023-03-15 LAB — FOLATE: Folate: 13.3 ng/mL (ref >5.9)

## 2023-03-15 LAB — VITAMIN D 25 HYDROXY (VIT D DEFICIENCY, FRACTURES): VITD: 16.69 ng/mL — ABNORMAL LOW (ref 30.00–100.00)

## 2023-03-15 LAB — VITAMIN B12: Vitamin B-12: 296 pg/mL (ref 211–911)

## 2023-03-15 LAB — HEMOGLOBIN A1C: Hgb A1c MFr Bld: 5.8 % (ref 4.6–6.5)

## 2023-03-15 NOTE — Assessment & Plan Note (Addendum)
She stopped smoking since her last visit.  Congratulated her on cessation.  We are checking x-ray today as above.  May be a candidate for routine lung cancer screening starting at age 45.

## 2023-03-15 NOTE — Assessment & Plan Note (Signed)
Check lipids. Discussed lifestyle modifications.  

## 2023-03-15 NOTE — Assessment & Plan Note (Signed)
Check A1c. 

## 2023-03-15 NOTE — Assessment & Plan Note (Signed)
Check prolactin  

## 2023-03-15 NOTE — Patient Instructions (Signed)
It was very nice to see you today!  We will check blood work and a chest x-ray today.  We will refer you for colonoscopy.  Will also refer you for cardiac CT scan.  We will see back in year for your next physical.  Come back sooner if needed.  Return in about 1 year (around 03/14/2024) for Annual Physical.   Take care, Dr [name]  PLEASE NOTE:  If you had any lab tests, please let us know if you have not heard back within a few days. You may see your results on mychart before we have a chance to review them but we will give you a call once they are reviewed by [place].   If we ordered any referrals today, please let us know if you have not heard from their office within the next week.   If you had any urgent prescriptions sent in today, please check with the pharmacy within an hour of our visit to make sure the prescription was transmitted appropriately.   Please try these tips to maintain a healthy lifestyle:  Eat at least 3 REAL meals and 1-2 snacks per day.  Aim for no more than 5 hours between eating.  If you eat breakfast, please do so within one hour of getting up.   Each meal should contain half fruits/vegetables, one quarter protein, and one quarter carbs (no bigger than a computer mouse)  Cut down on sweet beverages. This includes juice, soda, and sweet tea.   Drink at least 1 glass of water with each meal and aim for at least 8 glasses per day  Exercise at least 150 minutes every week.    Preventive Care 50-68 Years Old, Female Preventive care refers to lifestyle choices and visits with your health care provider that can promote health and wellness. Preventive care visits are also called wellness exams. What can I expect for my preventive care visit? Counseling Your health care provider may ask you questions about your: Medical history, including: Past medical problems. Family medical history. Pregnancy history. Current health, including: Menstrual cycle. Method of  birth control. Emotional well-being. Home life and relationship well-being. Sexual activity and sexual health. Lifestyle, including: Alcohol, nicotine or tobacco, and drug use. Access to firearms. Diet, exercise, and sleep habits. Work and work Astronomer. Sunscreen use. Safety issues such as seatbelt and bike helmet use. Physical exam Your health care provider will check your: Height and weight. These may be used to calculate your BMI (body mass index). BMI is a measurement that tells if you are at a healthy weight. Waist circumference. This measures the distance around your waistline. This measurement also tells if you are at a healthy weight and may help predict your risk of certain diseases, such as type 2 diabetes and high blood pressure. Heart rate and blood pressure. Body temperature. Skin for abnormal spots. What immunizations do I need?  Vaccines are usually given at various ages, according to a schedule. Your health care provider will recommend vaccines for you based on your age, medical history, and lifestyle or other factors, such as travel or where you work. What tests do I need? Screening Your health care provider may recommend screening tests for certain conditions. This may include: Lipid and cholesterol levels. Diabetes screening. This is done by checking your blood sugar (glucose) after you have not eaten for a while (fasting). Pelvic exam and Pap test. Hepatitis B test. Hepatitis C test. HIV (human immunodeficiency virus) test. STI (sexually transmitted infection) testing,  if you are at risk. Lung cancer screening. Colorectal cancer screening. Mammogram. Talk with your health care provider about when you should start having regular mammograms. This may depend on whether you have a family history of breast cancer. BRCA-related cancer screening. This may be done if you have a family history of breast, ovarian, tubal, or peritoneal cancers. Bone density scan. This  is done to screen for osteoporosis. Talk with your health care provider about your test results, treatment options, and if necessary, the need for more tests. Follow these instructions at home: Eating and drinking  Eat a diet that includes fresh fruits and vegetables, whole grains, lean protein, and low-fat dairy products. Take vitamin and mineral supplements as recommended by your health care provider. Do not drink alcohol if: Your health care provider tells you not to drink. You are pregnant, may be pregnant, or are planning to become pregnant. If you drink alcohol: Limit how much you have to 0-1 drink a day. Know how much alcohol is in your drink. In the [place] one drink equals one 12 oz bottle of beer (355 mL), one 5 oz glass of wine (148 mL), or one 1 oz glass of hard liquor (44 mL). Lifestyle Brush your teeth every morning and night with fluoride toothpaste. Floss one time each day. Exercise for at least 30 minutes 5 or more days each week. Do not use any products that contain nicotine or tobacco. These products include cigarettes, chewing tobacco, and vaping devices, such as e-cigarettes. If you need help quitting, ask your health care provider. Do not use drugs. If you are sexually active, practice safe sex. Use a condom or other form of protection to prevent STIs. If you do not wish to become pregnant, use a form of birth control. If you plan to become pregnant, see your health care provider for a prepregnancy visit. Take aspirin only as told by your health care provider. Make sure that you understand how much to take and what form to take. Work with your health care provider to find out whether it is safe and beneficial for you to take aspirin daily. Find healthy ways to manage stress, such as: Meditation, yoga, or listening to music. Journaling. Talking to a trusted person. Spending time with friends and family. Minimize exposure to UV radiation to reduce your risk of skin  cancer. Safety Always wear your seat belt while driving or riding in a vehicle. Do not drive: If you have been drinking alcohol. Do not ride with someone who has been drinking. When you are tired or distracted. While texting. If you have been using any mind-altering substances or drugs. Wear a helmet and other protective equipment during sports activities. If you have firearms in your house, make sure you follow all gun safety procedures. Seek help if you have been physically or sexually abused. What's next? Visit your health care provider once a year for an annual wellness visit. Ask your health care provider how often you should have your eyes and teeth checked. Stay up to date on all vaccines. This information is not intended to replace advice given to you by your health care provider. Make sure you discuss any questions you have with your health care provider. Document Revised: 08/04/2020 Document Reviewed: 08/04/2020 Elsevier Patient Education  2024 [employer].

## 2023-03-15 NOTE — Progress Notes (Addendum)
Chief Complaint:  Bonnie Parsons is a 43 y.o. female who presents today for her annual comprehensive physical exam.  She last seen here over 3 years ago.   Assessment/Plan:  New/Acute Problems: Cough  No red flags.  Likely related to recent URIs though is a former smoker.  Her recent pulmonary workup a couple years ago was negative.  Will check chest x-ray today per patient request.  We discussed reasons to return to care.  This continues to be an issue she can follow-up with pulmonology.  Elevated MCV No red flags.  Recent CBC is normalized.  Will recheck today.  Also check B12 and folate.  Chronic Problems Addressed Today: Hyperglycemia Check A1c.   Dyslipidemia Check lipids.  Discussed lifestyle modifications.  Elevated prolactin level (HCC) Check prolactin.  Thyroid nodule Check TSH.  Will also order thyroid ultrasound.  If thyroid ultrasound is normal we will not need to do any further testing for this.  Former smoker She stopped smoking since her last visit.  Congratulated her on cessation.  We are checking x-ray today as above.  May be a candidate for routine lung cancer screening starting at age 37.  Preventative Healthcare: Check labs.  Will refer for colonoscopy.  Will follow-up with GYN soon for Pap and mammogram.  Patient Counseling(The following topics were reviewed and/or handout was given):  -Nutrition: Stressed importance of moderation in sodium/caffeine intake, saturated fat and cholesterol, caloric balance, sufficient intake of fresh fruits, vegetables, and fiber.  -Stressed the importance of regular exercise.   -Substance Abuse: Discussed cessation/primary prevention of tobacco, alcohol, or other drug use; driving or other dangerous activities under the influence; availability of treatment for abuse.   -Injury prevention: Discussed safety belts, safety helmets, smoke detector, smoking near bedding or upholstery.   -Sexuality: Discussed sexually  transmitted diseases, partner selection, use of condoms, avoidance of unintended pregnancy and contraceptive alternatives.   -Dental health: Discussed importance of regular tooth brushing, flossing, and dental visits.  -Health maintenance and immunizations reviewed. Please refer to Health maintenance section.  Return to care in 1 year for next preventative visit.     Subjective:  HPI:  She has no acute complaints today. See assessment / plan for status of chronic conditions.  She did stop smoking since her last visit.  Still has occasional cough.  Lifestyle Diet: Trying to get more vegetables.  Exercise: Trying to get more cardio.Likes walking.      03/15/2023   11:40 AM  Depression screen PHQ 2/9  Decreased Interest 0  Down, Depressed, Hopeless 0  PHQ - 2 Score 0    There are no preventive care reminders to display for this patient.   ROS: Per HPI, otherwise a complete review of systems was negative.   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   Chronic pain of left knee 01/23/2020   Dyslipidemia 12/24/2017   Hyperglycemia 12/21/2017   Elevated prolactin level 12/15/2016   Former smoker 12/15/2016   Contraception management 12/15/2016   Thyroid nodule 12/03/2015   Past Surgical History:  Procedure Laterality Date   CESAREAN SECTION      Family History  Problem Relation Age of Onset   Cancer Father        Lung   Early death Father    Hyperlipidemia  Paternal Grandmother    Hypertension Paternal Grandmother    Thyroid disease Mother    Asthma Mother    Breast cancer Neg Hx     Medications- reviewed and updated No current outpatient medications on file.   No current facility-administered medications for this visit.    Allergies-reviewed and updated Allergies   Allergen Reactions   No Known Allergies     Social History   Socioeconomic History   Marital status: Married    Spouse name: Not on file   Number of children: Not on file   Years of education: Not on file   Highest education level: Not on file  Occupational History   Not on file  Tobacco Use   Smoking status: Former    Current packs/day: 0.00    Average packs/day: 0.3 packs/day for 20.0 years (5.0 ttl pk-yrs)    Types: Cigarettes    Start date: 01/2000    Quit date: 01/2020    Years since quitting: 3.1   Smokeless tobacco: Never  Vaping Use   Vaping status: Never Used  Substance and Sexual Activity   Alcohol use: Yes   Drug use: No   Sexual activity: Yes    Partners: Male    Birth control/protection: Inserts  Other Topics Concern   Not on file  Social History Narrative   Not on file   Social Drivers of Health   Financial Resource Strain: Not on file  Food Insecurity: No Food Insecurity (04/21/2020)   Hunger Vital Sign    Worried About Running Out of Food in the Last Year: Never true    Ran Out of Food in the Last Year: Never true  Transportation Needs: No Transportation Needs (04/21/2020)   PRAPARE - Administrator, Civil Service (Medical): No    Lack of Transportation (Non-Medical): No  Physical Activity: Not on file  Stress: Not on file  Social Connections: Not on file        Objective:  Physical Exam: BP 111/77   Pulse 61   Temp 98.2 F (36.8 C) (Oral)   Ht 5\' 6"  (1.676 m)   Wt 156 lb (70.8 kg)   LMP 02/27/2023   SpO2 100%   BMI 25.18 kg/m   Body mass index is 25.18 kg/m. Wt Readings from Last 3 Encounters:  03/15/23 156 lb (70.8 kg)  01/08/23 152 lb 4 oz (69.1 kg)  05/11/20 157 lb 9.6 oz (71.5 kg)   Gen: NAD, resting comfortably HEENT: TMs normal bilaterally. OP clear. No thyromegaly noted.  CV: RRR with no murmurs appreciated Pulm: NWOB, CTAB with no crackles, wheezes, or rhonchi GI: Normal bowel sounds present. Soft,  Nontender, Nondistended. MSK: no edema, cyanosis, or clubbing noted Skin: warm, dry Neuro: CN2-12 grossly intact. Strength 5/5 in upper and lower extremities. Reflexes symmetric and intact bilaterally.  Psych: Normal affect and thought content     Shean Gerding M. [name], MD 03/15/2023 12:20 PM

## 2023-03-15 NOTE — Assessment & Plan Note (Signed)
Check TSH.  Will also order thyroid ultrasound.  If thyroid ultrasound is normal we will not need to do any further testing for this.

## 2023-03-17 LAB — B. BURGDORFI ANTIBODIES: B burgdorferi Ab IgG+IgM: <0.9 {index}

## 2023-03-19 ENCOUNTER — Telehealth: Payer: Self-pay | Admitting: *Deleted

## 2023-03-19 NOTE — Telephone Encounter (Signed)
Copied from CRM [id number]. Topic: General - Other >> Mar 19, 2023  1:37 PM Elizebeth Brooking wrote: Reason for CRM: Patient called in regarding lab results and imaging results, is requesting a callback for a more in dept explaining of those results   See results note  Charise Leinbach,RMA

## 2023-03-20 ENCOUNTER — Ambulatory Visit (HOSPITAL_BASED_OUTPATIENT_CLINIC_OR_DEPARTMENT_OTHER)
Admission: RE | Admit: 2023-03-20 | Discharge: 2023-03-20 | Disposition: A | Discharge status: Home / Self Care | Payer: Self-pay | Source: Ambulatory Visit | Attending: Family Medicine | Admitting: Family Medicine

## 2023-03-20 ENCOUNTER — Ambulatory Visit (HOSPITAL_BASED_OUTPATIENT_CLINIC_OR_DEPARTMENT_OTHER)
Admission: RE | Admit: 2023-03-20 | Discharge: 2023-03-20 | Disposition: A | Discharge status: Home / Self Care | Payer: 59 | Source: Ambulatory Visit | Attending: Family Medicine | Admitting: Family Medicine

## 2023-03-20 ENCOUNTER — Ambulatory Visit (HOSPITAL_COMMUNITY): Payer: 59

## 2023-03-20 DIAGNOSIS — Z0001 Encounter for general adult medical examination with abnormal findings: Secondary | ICD-10-CM | POA: Diagnosis present

## 2023-03-20 DIAGNOSIS — E785 Hyperlipidemia, unspecified: Secondary | ICD-10-CM | POA: Insufficient documentation

## 2023-03-21 ENCOUNTER — Encounter: Payer: Self-pay | Admitting: Family Medicine

## 2023-03-21 NOTE — Progress Notes (Signed)
Vitamin D is low.  Recommend she start 50,000 IUs weekly.  We should recheck in 3 to 6 months.  Please send in prescription.  The rest of her labs are all stable.  Do not need to make any other changes to treatment plan.  We can recheck everything else in here.

## 2023-03-21 NOTE — Progress Notes (Signed)
Cardiac CT scan shows score of 0.  This is normal.  She should keep up the great work with diet and exercise.  We can recheck in 5 to 10 years.

## 2023-03-21 NOTE — Progress Notes (Signed)
Chest xray is normal

## 2023-03-22 ENCOUNTER — Other Ambulatory Visit: Payer: Self-pay | Admitting: *Deleted

## 2023-03-22 MED ORDER — VITAMIN D (ERGOCALCIFEROL) 1.25 MG (50000 UNIT) PO CAPS: 50000.0000 [IU] | ORAL_CAPSULE | ORAL | 0 refills | Status: AC

## 2023-03-26 ENCOUNTER — Encounter: Payer: Self-pay | Admitting: Family Medicine

## 2023-03-26 NOTE — Progress Notes (Signed)
Her thyroid nodule has resolved.  Do not need to do any other testing at this point.

## 2023-05-17 ENCOUNTER — Ambulatory Visit
Admission: RE | Admit: 2023-05-17 | Discharge: 2023-05-17 | Disposition: A | Discharge status: Home / Self Care | Source: Ambulatory Visit | Attending: Family Medicine | Admitting: Family Medicine

## 2023-05-17 ENCOUNTER — Other Ambulatory Visit: Payer: Self-pay | Admitting: Family Medicine

## 2023-05-17 ENCOUNTER — Encounter: Payer: Self-pay | Admitting: Family Medicine

## 2023-05-17 ENCOUNTER — Ambulatory Visit (INDEPENDENT_AMBULATORY_CARE_PROVIDER_SITE_OTHER): Admitting: Family Medicine

## 2023-05-17 VITALS — BP 122/84 | Ht 65.0 in | Wt 157.0 lb

## 2023-05-17 DIAGNOSIS — M1712 Unilateral primary osteoarthritis, left knee: Secondary | ICD-10-CM | POA: Diagnosis not present

## 2023-05-17 DIAGNOSIS — G8929 Other chronic pain: Secondary | ICD-10-CM

## 2023-05-17 DIAGNOSIS — M25562 Pain in left knee: Secondary | ICD-10-CM

## 2023-05-17 NOTE — Assessment & Plan Note (Signed)
 Acute on chronic left knee pain with known underlying osteoarthritis, suspect osteoarthritis flare  Plan: -Prior visit notes and x-rays were reviewed as noted above -Recommend updated imaging with x-ray to evaluate progression of osteoarthritis -Recommended over-the-counter Voltaren gel every 6 hours as needed -Continue knee sleeve with activity -Instructed on home exercise program -Did discuss merit and role of cortisone injections, she is not interested in these at this time.  Could consider in the future -Will reach out with x-ray results and discuss further treatment at that time

## 2023-05-17 NOTE — Progress Notes (Signed)
 DATE OF VISIT: 05/17/2023        Bonnie Parsons DOB: [birth year] MRN: [id number]  CC:  Lt knee pain  History of present Illness: Bonnie Parsons is a 43 y.o. female who presents for evaluation of Lt knee pain Intermittent knee pain x several years Previously by Dr Denyse Amass at [facility] 01/27/20 - notes reviewed today - MSK u/s showed degenerative lateral meniscus  - dx with PFPS - given HEP  Today reports increasing pain Started after a run over the weekend - completed 5k race a slow pace Pain along medial knee (+)painful popping No swelling No instability Not taking medications for this Using occ knee sleeve Has never done formal PT No recent imaging  Medications:  Outpatient Encounter Medications as of 05/17/2023  Medication Sig   Vitamin D, Ergocalciferol, (DRISDOL) 1.25 MG (50000 UNIT) CAPS capsule Take 1 capsule (50,000 Units total) by mouth every 7 (seven) days.   No facility-administered encounter medications on file as of 05/17/2023.    Allergies: is allergic to no known allergies.  Physical Examination: Vitals: BP 122/84   Ht 5\' 5"  (1.651 m)   Wt 157 lb (71.2 kg)   BMI 26.13 kg/m  GENERAL:  Bonnie Parsons is a 43 y.o. female appearing their stated age, alert and oriented x 3, in no apparent distress.  SKIN: no rashes or lesions, skin clean, dry, intact MSK: LT knee with small effusion, no increased redness or warmth.  Mild medial joint line TTP, no lateral joint line TTP, neg McMurray, neg Lachman, neg varus/valgus stress.   RT knee with FROM without pain or instability Walking without a limp N/V/I distally  Radiology: XRAY:  LT KNEE XR 3 VIEWS AP/LAT/SUNRISE 01/27/20 personally reviewed and interpreted by me today showing: - mild medial joint space narrowing c/w mild OA - mild lateralization of patella on sunrise view  Assessment & Plan Chronic pain of left knee Acute on chronic left knee pain with known underlying osteoarthritis, suspect  osteoarthritis flare  Plan: -Prior visit notes and x-rays were reviewed as noted above -Recommend updated imaging with x-ray to evaluate progression of osteoarthritis -Recommended over-the-counter Voltaren gel every 6 hours as needed -Continue knee sleeve with activity -Instructed on home exercise program -Did discuss merit and role of cortisone injections, she is not interested in these at this time.  Could consider in the future -Will reach out with x-ray results and discuss further treatment at that time Primary osteoarthritis of left knee Acute on chronic left knee pain with known underlying osteoarthritis, suspect osteoarthritis flare  Plan: -Prior visit notes and x-rays were reviewed as noted above -Recommend updated imaging with x-ray to evaluate progression of osteoarthritis -Recommended over-the-counter Voltaren gel every 6 hours as needed -Continue knee sleeve with activity -Instructed on home exercise program -Did discuss merit and role of cortisone injections, she is not interested in these at this time.  Could consider in the future -Will reach out with x-ray results and discuss further treatment at that time   Patient expressed understanding & agreement with above.  No diagnosis found.  No orders of the defined types were placed in this encounter.

## 2023-05-18 ENCOUNTER — Other Ambulatory Visit: Payer: Self-pay | Admitting: Family Medicine

## 2023-05-18 ENCOUNTER — Ambulatory Visit
Admission: RE | Admit: 2023-05-18 | Discharge: 2023-05-18 | Disposition: A | Discharge status: Home / Self Care | Source: Ambulatory Visit | Attending: Family Medicine | Admitting: Family Medicine

## 2023-05-18 DIAGNOSIS — Z1231 Encounter for screening mammogram for malignant neoplasm of breast: Secondary | ICD-10-CM

## 2023-05-22 ENCOUNTER — Encounter: Payer: Self-pay | Admitting: Family Medicine

## 2023-06-01 ENCOUNTER — Ambulatory Visit

## 2023-06-01 VITALS — Ht 65.0 in | Wt 158.0 lb

## 2023-06-01 DIAGNOSIS — Z1211 Encounter for screening for malignant neoplasm of colon: Secondary | ICD-10-CM

## 2023-06-01 MED ORDER — NA SULFATE-K SULFATE-MG SULF 17.5-3.13-1.6 GM/177ML PO SOLN: 1.0000 | Freq: Once | ORAL | 0 refills | Status: AC

## 2023-06-01 NOTE — Progress Notes (Signed)

## 2023-06-29 ENCOUNTER — Encounter: Payer: Self-pay | Admitting: Gastroenterology

## 2023-06-29 ENCOUNTER — Ambulatory Visit (AMBULATORY_SURGERY_CENTER): Admitting: Gastroenterology

## 2023-06-29 VITALS — BP 104/47 | HR 56 | Temp 98.7°F | Resp 20 | Ht 65.0 in | Wt 158.0 lb

## 2023-06-29 DIAGNOSIS — Q438 Other specified congenital malformations of intestine: Secondary | ICD-10-CM

## 2023-06-29 DIAGNOSIS — D123 Benign neoplasm of transverse colon: Secondary | ICD-10-CM

## 2023-06-29 DIAGNOSIS — Z1211 Encounter for screening for malignant neoplasm of colon: Secondary | ICD-10-CM | POA: Diagnosis present

## 2023-06-29 DIAGNOSIS — D122 Benign neoplasm of ascending colon: Secondary | ICD-10-CM | POA: Diagnosis not present

## 2023-06-29 MED ORDER — SODIUM CHLORIDE 0.9 % IV SOLN
500.0000 mL | INTRAVENOUS | Status: DC
Start: 2023-06-29 — End: 2023-06-29

## 2023-06-29 NOTE — Progress Notes (Signed)
 [facility] History and Physical   Primary Care Physician:  Rodney Clamp, MD   Reason for Procedure:   Colon cancer screening  Plan:    Screening colonoscopy     HPI: Bonnie Parsons is a 43 y.o. female undergoing initial average risk screening colonoscopy.  She has no family history of colon cancer and no chronic GI symptoms.    Past Medical History:  Diagnosis Date   Thyroid  disease     Past Surgical History:  Procedure Laterality Date   CESAREAN SECTION      Prior to Admission medications   Medication Sig Start Date End Date Taking? Authorizing Provider  Vitamin D , Ergocalciferol , (DRISDOL ) 1.25 MG (50000 UNIT) CAPS capsule Take 1 capsule (50,000 Units total) by mouth every 7 (seven) days. 03/22/23   Rodney Clamp, MD    Current Outpatient Medications  Medication Sig Dispense Refill   Vitamin D , Ergocalciferol , (DRISDOL ) 1.25 MG (50000 UNIT) CAPS capsule Take 1 capsule (50,000 Units total) by mouth every 7 (seven) days. 12 capsule 0   Current Facility-Administered Medications  Medication Dose Route Frequency Provider Last Rate Last Admin   0.9 %  sodium chloride infusion  500 mL Intravenous Continuous Elois Hair, MD        Allergies as of 06/29/2023 - Review Complete 06/29/2023  Allergen Reaction Noted   No known allergies      Family History  Problem Relation Age of Onset   Colon polyps Mother    Thyroid  disease Mother    Asthma Mother    Cancer Father        Lung   Early death Father    Hyperlipidemia Paternal Grandmother    Hypertension Paternal Grandmother    Breast cancer Neg Hx    Colon cancer Neg Hx    Rectal cancer Neg Hx    Stomach cancer Neg Hx    Esophageal cancer Neg Hx     Social History   Socioeconomic History   Marital status: Married    Spouse name: Not on file   Number of children: Not on file   Years of education: Not on file   Highest education level: Not on file  Occupational History   Not on  file  Tobacco Use   Smoking status: Former    Current packs/day: 0.00    Average packs/day: 0.3 packs/day for 20.0 years (5.0 ttl pk-yrs)    Types: Cigarettes    Start date: 01/2000    Quit date: 01/2020    Years since quitting: 3.4   Smokeless tobacco: Never  Vaping Use   Vaping status: Never Used  Substance and Sexual Activity   Alcohol use: Yes    Comment: Occ   Drug use: No   Sexual activity: Yes    Partners: Male    Birth control/protection: Inserts  Other Topics Concern   Not on file  Social History Narrative   Not on file   Social Drivers of Health   Financial Resource Strain: Not on file  Food Insecurity: No Food Insecurity (04/21/2020)   Hunger Vital Sign    Worried About Running Out of Food in the Last Year: Never true    Ran Out of Food in the Last Year: Never true  Transportation Needs: No Transportation Needs (04/21/2020)   PRAPARE - Administrator, Civil Service (Medical): No    Lack of Transportation (Non-Medical): No  Physical Activity: Not on file  Stress: Not on file  Social Connections: Not on file  Intimate Partner Violence: Not on file    Review of Systems:  All other review of systems negative except as mentioned in the HPI.  Physical Exam: Vital signs BP 115/74   Pulse (!) 58   Temp 98.7 F (37.1 C) (Temporal)   Ht 5\' 5"  (1.651 m)   Wt 158 lb (71.7 kg)   LMP 06/01/2023 (Exact Date)   SpO2 99%   BMI 26.29 kg/m   General:   Alert,  Well-developed, well-nourished, pleasant and cooperative in NAD Airway:  Mallampati 1 Lungs:  Clear throughout to auscultation.   Heart:  Regular rate and rhythm; no murmurs, clicks, rubs,  or gallops. Abdomen:  Soft, nontender and nondistended. Normal bowel sounds.   Neuro/Psych:  Normal mood and affect. A and O x 3   Natalin Bible E. Cherryl Corona, MD [facility]

## 2023-06-29 NOTE — Op Note (Signed)
 [facility] Patient Name: Bonnie Parsons Procedure Date: 06/29/2023 8:24 AM MRN: [id number] Endoscopist: Geralyn Knee E. Cherryl Corona , MD, [id number] Age: 43 Referring MD:  Date of Birth: 09/13/1980 Gender: Female Account #: [id number] Procedure:                Colonoscopy Indications:              Screening for colorectal malignant neoplasm, This                            is the patient's first colonoscopy Medicines:                Monitored Anesthesia Care Procedure:                Pre-Anesthesia Assessment:                           - Prior to the procedure, a History and Physical                            was performed, and patient medications and                            allergies were reviewed. The patient's tolerance of                            previous anesthesia was also reviewed. The risks                            and benefits of the procedure and the sedation                            options and risks were discussed with the patient.                            All questions were answered, and informed consent                            was obtained. Prior Anticoagulants: The patient has                            taken no anticoagulant or antiplatelet agents. ASA                            Grade Assessment: I - A normal, healthy patient.                            After reviewing the risks and benefits, the patient                            was deemed in satisfactory condition to undergo the                            procedure.  After obtaining informed consent, the colonoscope                            was passed under direct vision. Throughout the                            procedure, the patient's blood pressure, pulse, and                            oxygen saturations were monitored continuously. The                            Olympus Scope SN: [id number] was introduced through                            the anus and advanced to the the  terminal ileum,                            with identification of the appendiceal orifice and                            IC valve. The colonoscopy was somewhat difficult                            due to a tortuous colon. Successful completion of                            the procedure was aided by using manual pressure.                            The patient tolerated the procedure well. The                            quality of the bowel preparation was adequate. The                            terminal ileum, ileocecal valve, appendiceal                            orifice, and rectum were photographed. The bowel                            preparation used was SUPREP via split dose                            instruction. Scope In: 8:32:12 AM Scope Out: 8:54:50 AM Scope Withdrawal Time: 0 hours 15 minutes 31 seconds  Total Procedure Duration: 0 hours 22 minutes 38 seconds  Findings:                 The perianal and digital rectal examinations were                            normal. Pertinent negatives include normal  sphincter tone and no palpable rectal lesions.                           Two sessile polyps were found in the ascending                            colon. The polyps were 2 to 13 mm in size. These                            polyps were removed with a cold snare. Resection                            and retrieval were complete. Estimated blood loss                            was minimal.                           A 4 mm polyp was found in the splenic flexure. The                            polyp was sessile. The polyp was removed with a                            cold snare. Resection and retrieval were complete.                            Estimated blood loss was minimal.                           The exam was otherwise normal throughout the                            examined colon.                           The terminal ileum appeared normal.                            The retroflexed view of the distal rectum and anal                            verge was normal and showed no anal or rectal                            abnormalities. Complications:            No immediate complications. Estimated Blood Loss:     Estimated blood loss was minimal. Impression:               - Two 2 to 13 mm polyps in the ascending colon,                            removed with a cold snare. Resected and retrieved.                           -  One 4 mm polyp at the splenic flexure, removed                            with a cold snare. Resected and retrieved.                           - The examined portion of the ileum was normal.                           - The distal rectum and anal verge are normal on                            retroflexion view. Recommendation:           - Patient has a contact number available for                            emergencies. The signs and symptoms of potential                            delayed complications were discussed with the                            patient. Return to normal activities tomorrow.                            Written discharge instructions were provided to the                            patient.                           - Resume previous diet.                           - Continue present medications.                           - Await pathology results.                           - Repeat colonoscopy (date not yet determined) for                            surveillance based on pathology results. Christoper Bushey E. Cherryl Corona, MD 06/29/2023 9:00:18 AM This report has been signed electronically.

## 2023-06-29 NOTE — Progress Notes (Signed)
 Sedate, gd SR, tolerated procedure well, VSS, report to RN

## 2023-06-29 NOTE — Patient Instructions (Signed)
 Thank you for letting us  care for your healthcare needs today! Please see handout regarding Polyps.   YOU HAD AN ENDOSCOPIC PROCEDURE TODAY AT THE [facility]:   Refer to the procedure report that was given to you for any specific questions about what was found during the examination.  If the procedure report does not answer your questions, please call your gastroenterologist to clarify.  If you requested that your care partner not be given the details of your procedure findings, then the procedure report has been included in a sealed envelope for you to review at your convenience later.  YOU SHOULD EXPECT: Some feelings of bloating in the abdomen. Passage of more gas than usual.  Walking can help get rid of the air that was put into your GI tract during the procedure and reduce the bloating. If you had a lower endoscopy (such as a colonoscopy or flexible sigmoidoscopy) you may notice spotting of blood in your stool or on the toilet paper. If you underwent a bowel prep for your procedure, you may not have a normal bowel movement for a few days.  Please Note:  You might notice some irritation and congestion in your nose or some drainage.  This is from the oxygen used during your procedure.  There is no need for concern and it should clear up in a day or so.  SYMPTOMS TO REPORT IMMEDIATELY:  Following lower endoscopy (colonoscopy or flexible sigmoidoscopy):  Excessive amounts of blood in the stool  Significant tenderness or worsening of abdominal pains  Swelling of the abdomen that is new, acute  Fever of 100F or higher  For urgent or emergent issues, a gastroenterologist can be reached at any hour by calling [phone]. Do not use MyChart messaging for urgent concerns.    DIET:  We do recommend a small meal at first, but then you may proceed to your regular diet.  Drink plenty of fluids but you should avoid alcoholic beverages for 24 hours.  ACTIVITY:  You should plan to take  it easy for the rest of today and you should NOT DRIVE or use heavy machinery until tomorrow (because of the sedation medicines used during the test).    FOLLOW UP: Our staff will call the number listed on your records the next business day following your procedure.  We will call around 7:15- 8:00 am to check on you and address any questions or concerns that you may have regarding the information given to you following your procedure. If we do not reach you, we will leave a message.     If any biopsies were taken you will be contacted by phone or by letter within the next 1-3 weeks.  Please call us  at [phone] if you have not heard about the biopsies in 3 weeks.    SIGNATURES/CONFIDENTIALITY: You and/or your care partner have signed paperwork which will be entered into your electronic medical record.  These signatures attest to the fact that that the information above on your After Visit Summary has been reviewed and is understood.  Full responsibility of the confidentiality of this discharge information lies with you and/or your care-partner.

## 2023-07-02 ENCOUNTER — Telehealth: Payer: Self-pay

## 2023-07-02 NOTE — Telephone Encounter (Signed)
  Follow up Call-     06/29/2023    7:36 AM  Call back number  Post procedure Call Back phone  # [phone]  Permission to leave phone message Yes     Patient questions:  Do you have a fever, pain , or abdominal swelling? No. Pain Score  0 *  Have you tolerated food without any problems? Yes.    Have you been able to return to your normal activities? Yes.    Do you have any questions about your discharge instructions: Diet   No. Medications  No. Follow up visit  No.  Do you have questions or concerns about your Care? No.  Actions: * If pain score is 4 or above: No action needed, pain <4.

## 2023-07-03 LAB — SURGICAL PATHOLOGY

## 2023-07-06 ENCOUNTER — Ambulatory Visit: Payer: Self-pay | Admitting: Gastroenterology

## 2023-07-06 NOTE — Progress Notes (Signed)
 Ms. Berninger,  Two of the three polyps which I removed during your recent procedure were proven to be completely benign but are considered "pre-cancerous" polyps that MAY have grown into cancer if they had not been removed.  One polyp was not precancerous.  Studies shows that at least 20% of women over age 45 and 30% of men over age 31 have pre-cancerous polyps.  Based on current nationally recognized surveillance guidelines, I recommend that you have a repeat colonoscopy in 3 years.   If you develop any new rectal bleeding, abdominal pain or significant bowel habit changes, please contact me before then.
